# Patient Record
Sex: Female | Born: 1985 | Race: White | Hispanic: No | Marital: Married | Smoking: Never smoker
Health system: Southern US, Community
[De-identification: ages and names within clinical notes are randomized; demographics above are authoritative.]

## PROBLEM LIST (undated history)

## (undated) DIAGNOSIS — Z789 Other specified health status: Secondary | ICD-10-CM

## (undated) HISTORY — PX: FINGER SURGERY: SHX640

## (undated) HISTORY — DX: Other specified health status: Z78.9

---

## 2021-08-13 ENCOUNTER — Emergency Department (HOSPITAL_BASED_OUTPATIENT_CLINIC_OR_DEPARTMENT_OTHER): Payer: No Typology Code available for payment source

## 2021-08-13 ENCOUNTER — Other Ambulatory Visit: Payer: Self-pay

## 2021-08-13 ENCOUNTER — Encounter (HOSPITAL_BASED_OUTPATIENT_CLINIC_OR_DEPARTMENT_OTHER): Payer: Self-pay | Admitting: *Deleted

## 2021-08-13 ENCOUNTER — Emergency Department (HOSPITAL_BASED_OUTPATIENT_CLINIC_OR_DEPARTMENT_OTHER)
Admission: EM | Admit: 2021-08-13 | Discharge: 2021-08-13 | Disposition: A | Discharge status: Home / Self Care | Payer: No Typology Code available for payment source | Attending: Emergency Medicine | Admitting: Emergency Medicine

## 2021-08-13 DIAGNOSIS — R1110 Vomiting, unspecified: Secondary | ICD-10-CM | POA: Insufficient documentation

## 2021-08-13 DIAGNOSIS — R509 Fever, unspecified: Secondary | ICD-10-CM | POA: Insufficient documentation

## 2021-08-13 DIAGNOSIS — R1031 Right lower quadrant pain: Secondary | ICD-10-CM | POA: Insufficient documentation

## 2021-08-13 LAB — CBC WITH DIFFERENTIAL/PLATELET
Abs Immature Granulocytes: 0.03 10*3/uL (ref 0.00–0.07)
Basophils Absolute: 0 10*3/uL (ref 0.0–0.1)
Basophils Relative: 1 %
Eosinophils Absolute: 0.4 10*3/uL (ref 0.0–0.5)
Eosinophils Relative: 7 %
HCT: 40.2 % (ref 36.0–46.0)
Hemoglobin: 12.9 g/dL (ref 12.0–15.0)
Immature Granulocytes: 1 %
Lymphocytes Relative: 26 %
Lymphs Abs: 1.5 10*3/uL (ref 0.7–4.0)
MCH: 28 pg (ref 26.0–34.0)
MCHC: 32.1 g/dL (ref 30.0–36.0)
MCV: 87.2 fL (ref 80.0–100.0)
Monocytes Absolute: 0.6 10*3/uL (ref 0.1–1.0)
Monocytes Relative: 10 %
Neutro Abs: 3.3 10*3/uL (ref 1.7–7.7)
Neutrophils Relative %: 55 %
Platelets: 244 10*3/uL (ref 150–400)
RBC: 4.61 MIL/uL (ref 3.87–5.11)
RDW: 13.5 % (ref 11.5–15.5)
WBC: 5.8 10*3/uL (ref 4.0–10.5)
nRBC: 0 % (ref 0.0–0.2)

## 2021-08-13 LAB — PREGNANCY, URINE: Preg Test, Ur: NEGATIVE

## 2021-08-13 LAB — URINALYSIS, ROUTINE W REFLEX MICROSCOPIC
Glucose, UA: NEGATIVE mg/dL
Hgb urine dipstick: NEGATIVE
Ketones, ur: NEGATIVE mg/dL
Leukocytes,Ua: NEGATIVE
Nitrite: NEGATIVE
Protein, ur: 30 mg/dL — AB
Specific Gravity, Urine: >=1.03 (ref 1.005–1.030)
pH: 5.5 (ref 5.0–8.0)

## 2021-08-13 LAB — COMPREHENSIVE METABOLIC PANEL
ALT: 42 U/L (ref 0–44)
AST: 29 U/L (ref 15–41)
Albumin: 3.9 g/dL (ref 3.5–5.0)
Alkaline Phosphatase: 81 U/L (ref 38–126)
Anion gap: 9 (ref 5–15)
BUN: 13 mg/dL (ref 6–20)
CO2: 26 mmol/L (ref 22–32)
Calcium: 8.8 mg/dL — ABNORMAL LOW (ref 8.9–10.3)
Chloride: 101 mmol/L (ref 98–111)
Creatinine, Ser: 0.7 mg/dL (ref 0.44–1.00)
GFR, Estimated: >60 mL/min (ref >60)
Glucose, Bld: 100 mg/dL — ABNORMAL HIGH (ref 70–99)
Potassium: 3.6 mmol/L (ref 3.5–5.1)
Sodium: 136 mmol/L (ref 135–145)
Total Bilirubin: 0.4 mg/dL (ref 0.3–1.2)
Total Protein: 7.3 g/dL (ref 6.5–8.1)

## 2021-08-13 LAB — URINALYSIS, MICROSCOPIC (REFLEX)
RBC / HPF: NONE SEEN RBC/hpf (ref 0–5)
WBC, UA: NONE SEEN WBC/hpf (ref 0–5)

## 2021-08-13 LAB — LIPASE, BLOOD: Lipase: 30 U/L (ref 11–51)

## 2021-08-13 MED ORDER — IOHEXOL 300 MG/ML  SOLN
100.0000 mL | Freq: Once | INTRAMUSCULAR | Status: AC | PRN
  Administered 2021-08-13: 100 mL via INTRAVENOUS

## 2021-08-13 MED ORDER — ONDANSETRON HCL 4 MG/2ML IJ SOLN
4.0000 mg | Freq: Once | INTRAMUSCULAR | Status: AC
  Administered 2021-08-13: 4 mg via INTRAVENOUS
  Filled 2021-08-13: qty 2

## 2021-08-13 MED ORDER — SODIUM CHLORIDE 0.9 % IV BOLUS
1000.0000 mL | Freq: Once | INTRAVENOUS | Status: AC
  Administered 2021-08-13: 1000 mL via INTRAVENOUS

## 2021-08-13 MED ORDER — KETOROLAC TROMETHAMINE 30 MG/ML IJ SOLN
30.0000 mg | Freq: Once | INTRAMUSCULAR | Status: AC
  Administered 2021-08-13: 30 mg via INTRAVENOUS
  Filled 2021-08-13: qty 1

## 2021-08-13 NOTE — Discharge Instructions (Signed)
You have been seen and discharged from the emergency department.  Your CAT scan showed no surgical finding.  Take over-the-counter medications as needed for symptom control, stay well-hydrated.  Follow-up with your primary provider for further evaluation and further care. Take home medications as prescribed. If you have any worsening symptoms or further concerns for your health please return to an emergency department for further evaluation. ?

## 2021-08-13 NOTE — ED Triage Notes (Signed)
Back pain since yesterday. Fever 102 with vomiting. ?

## 2021-08-13 NOTE — ED Provider Notes (Signed)
?[facility] ?Provider Note ? ? ?CSN: [id number] ?Arrival date & time: 08/13/21  1550 ? ?  ? ?History ? ?Chief Complaint  ?Patient presents with  ? Back Pain  ? ? ?[name] is a 36 y.o. female. ? ?HPI ? ?36 year old female with no admitted past medical history presents emergency department complaining of right lower quadrant abdominal pain flank pain.  Patient states this started yesterday.  Initially the pain was right lower quadrant now it is radiating around to bilateral flanks.  She denies any history of kidney stones, no genitourinary symptoms.  She has diarrhea at baseline but her last bowel movement was a couple days ago which is unusual for her.  She had 1 episode of emesis yesterday, nonbloody.  No history of any surgeries in the abdomen.  She had a fever yesterday, Tmax 102. ? ?Home Medications ?Prior to Admission medications   ?Not on File  ?   ? ?Allergies    ?Patient has no known allergies.   ? ?Review of Systems   ?Review of Systems  ?Constitutional:  Positive for appetite change and fever.  ?Respiratory:  Negative for shortness of breath.   ?Cardiovascular:  Negative for chest pain.  ?Gastrointestinal:  Positive for abdominal pain, diarrhea, nausea and vomiting.  ?Genitourinary:  Positive for flank pain. Negative for dysuria, vaginal bleeding, vaginal discharge and vaginal pain.  ?Skin:  Negative for rash.  ?Neurological:  Negative for headaches.  ? ?Physical Exam ?Updated Vital Signs ?BP 114/79   Pulse 80   Temp 98.6 ?F (37 ?C) (Oral)   Resp 18   Ht 5\' 4"  (1.626 m)   Wt 81.6 kg   LMP 08/06/2021   SpO2 95%   BMI 30.90 kg/m?  ?Physical Exam ?Vitals and nursing note reviewed.  ?Constitutional:   ?   General: She is not in acute distress. ?   Appearance: Normal appearance. She is not diaphoretic.  ?HENT:  ?   Head: Normocephalic.  ?   Mouth/Throat:  ?   Mouth: Mucous membranes are moist.  ?Cardiovascular:  ?   Rate and Rhythm: Normal rate.  ?Pulmonary:  ?   Effort:  Pulmonary effort is normal. No respiratory distress.  ?Abdominal:  ?   Palpations: Abdomen is soft.  ?   Tenderness: There is abdominal tenderness. There is no guarding or rebound.  ?Skin: ?   General: Skin is warm.  ?Neurological:  ?   Mental Status: She is alert and oriented to person, place, and time. Mental status is at baseline.  ?Psychiatric:     ?   Mood and Affect: Mood normal.  ? ? ?ED Results / Procedures / Treatments   ?Labs ?(all labs ordered are listed, but only abnormal results are displayed) ?Labs Reviewed  ?URINALYSIS, ROUTINE W REFLEX MICROSCOPIC - Abnormal; Notable for the following components:  ?    Result Value  ? Color, Urine ORANGE (*)   ? Bilirubin Urine SMALL (*)   ? Protein, ur 30 (*)   ? All other components within normal limits  ?URINALYSIS, MICROSCOPIC (REFLEX) - Abnormal; Notable for the following components:  ? Bacteria, UA RARE (*)   ? All other components within normal limits  ?PREGNANCY, URINE  ?CBC WITH DIFFERENTIAL/PLATELET  ?COMPREHENSIVE METABOLIC PANEL  ?LIPASE, BLOOD  ? ? ?EKG ?None ? ?Radiology ?No results found. ? ?Procedures ?Procedures  ? ? ?Medications Ordered in ED ?Medications  ?ketorolac (TORADOL) 30 MG/ML injection 30 mg (30 mg Intravenous Given 08/13/21 1932)  ?  ondansetron [facility]) injection 4 mg (4 mg Intravenous Given 08/13/21 1929)  ?sodium chloride 0.9 % bolus 1,000 mL (1,000 mLs Intravenous New Bag/Given 08/13/21 1925)  ? ? ?ED Course/ Medical Decision Making/ A&P ?  ?                        ?Medical Decision Making ?Amount and/or Complexity of Data Reviewed ?Labs: ordered. ?Radiology: ordered. ? ?Risk ?Prescription drug management. ? ? ?36 year old female presents emergency department concern for right lower quadrant and bilateral flank pain.  Denies any genitourinary symptoms.  Had a fever yesterday with vomiting.  Vitals are normal and stable on arrival.  Abdomen is benign.  Blood work is reassuring. ? ?CT the abdomen pelvis identifies no acute pathology.  After  IV medication she feels much improved, was able to p.o.  Patient at this time appears safe and stable for discharge and close outpatient follow up. Discharge plan and strict return to ED precautions discussed, patient verbalizes understanding and agreement. ? ? ? ? ? ? ? ?Final Clinical Impression(s) / ED Diagnoses ?Final diagnoses:  ?None  ? ? ?Rx / DC Orders ?ED Discharge Orders   ? ? None  ? ?  ? ? ?  ?Lorelle Gibbs, DO ?08/13/21 2326 ? ?

## 2022-07-17 IMAGING — CT CT ABD-PELV W/ CM
2 of 4 series · 16 of 46 positions shown, 18 images · IV contrast (Omnipaque)
Comparison: None.

CLINICAL DATA: Abdominal pain.

EXAM:
CT ABDOMEN AND PELVIS WITH CONTRAST
TECHNIQUE: Multidetector CT imaging of the abdomen and pelvis was performed
using the standard protocol following bolus administration of
intravenous contrast.

[Series 2: axial st · axial · 0.98mm/px · z∈[+704,+1139]mm · 13 of 95 slices shown, 15 images]
[im 4/95  soft-tissue]
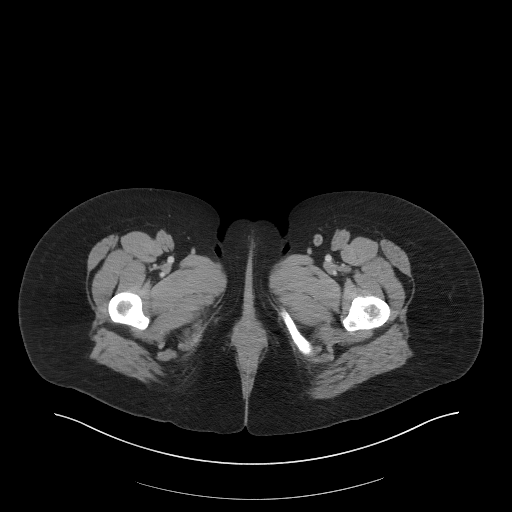
[im 4/95  bone]
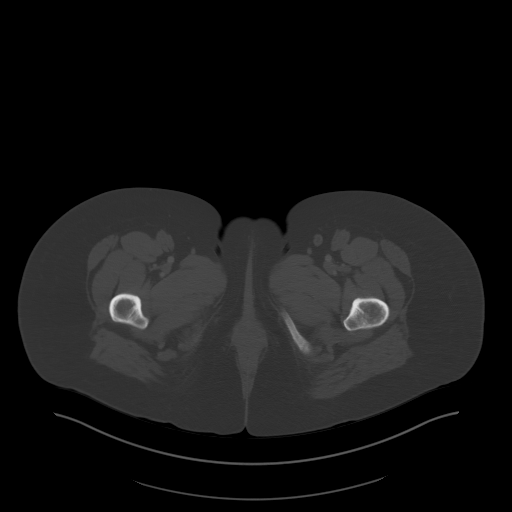
[im 12/95  soft-tissue]
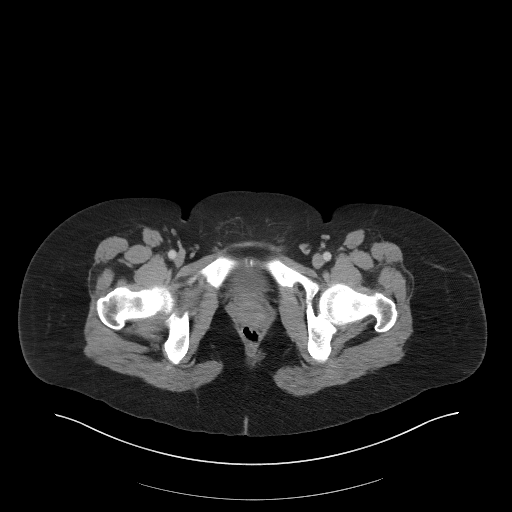
[im 20/95  soft-tissue]
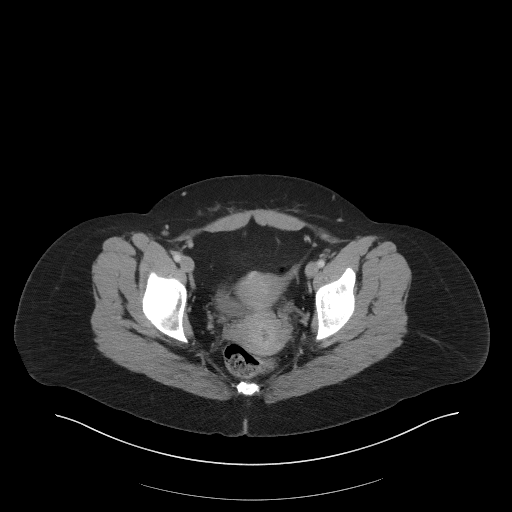
[im 28/95  soft-tissue]
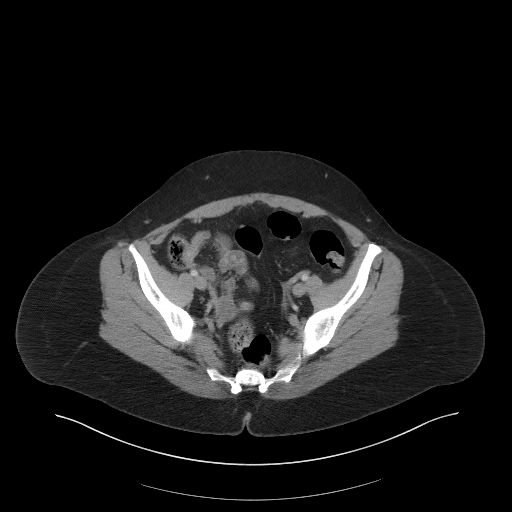
[im 32/95  soft-tissue]
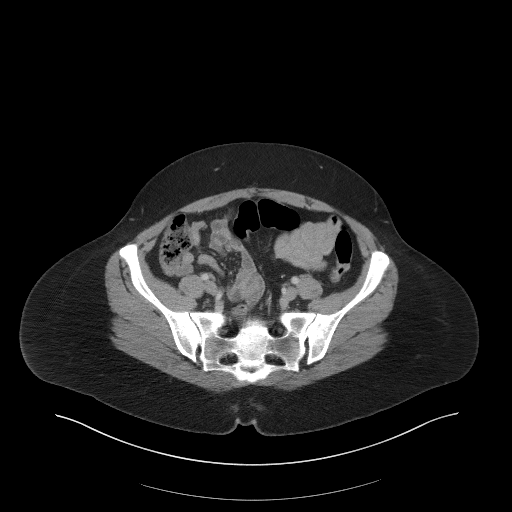
[im 40/95  soft-tissue]
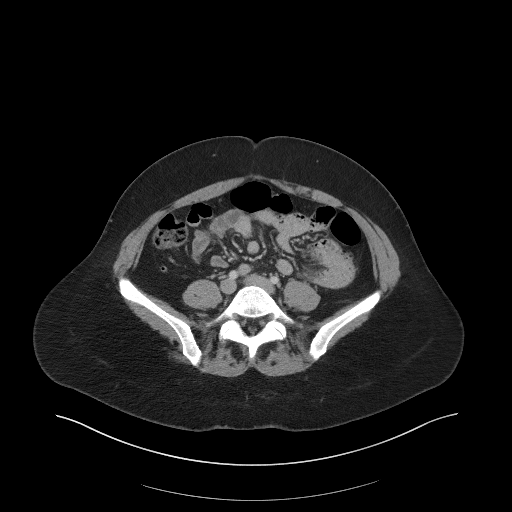
[im 48/95  soft-tissue]
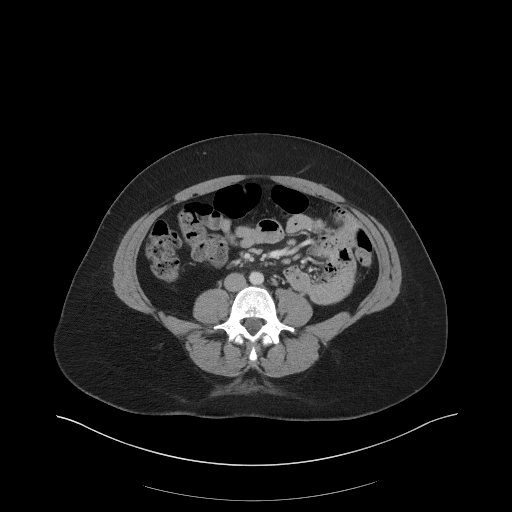
[im 55/95  soft-tissue]
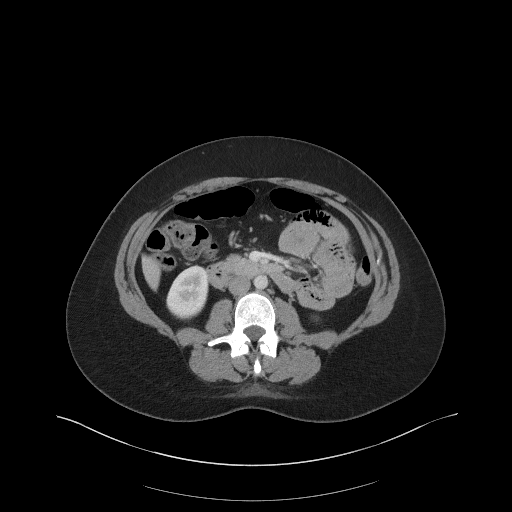
[im 63/95  soft-tissue]
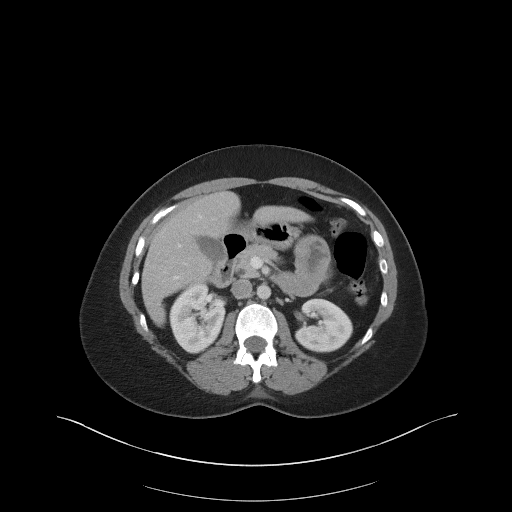
[im 63/95  bone]
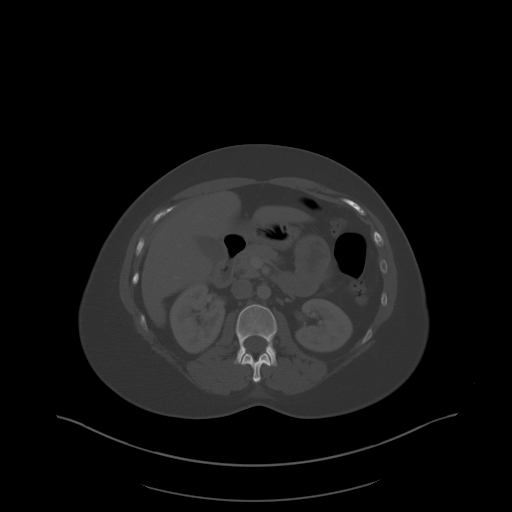
[im 67/95  soft-tissue]
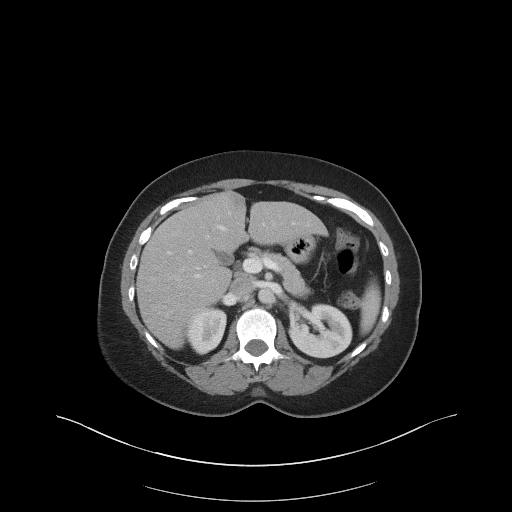
[im 75/95  soft-tissue]
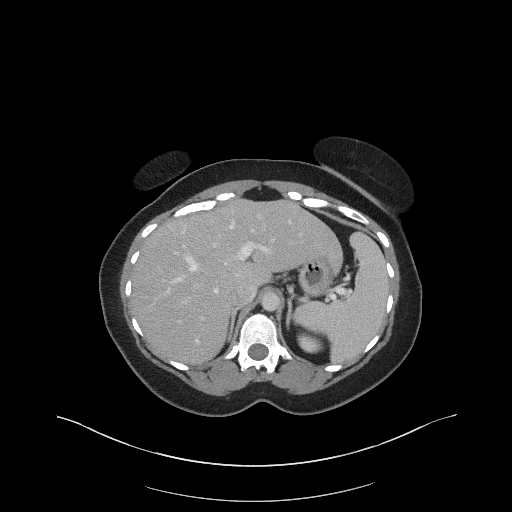
[im 83/95  soft-tissue]
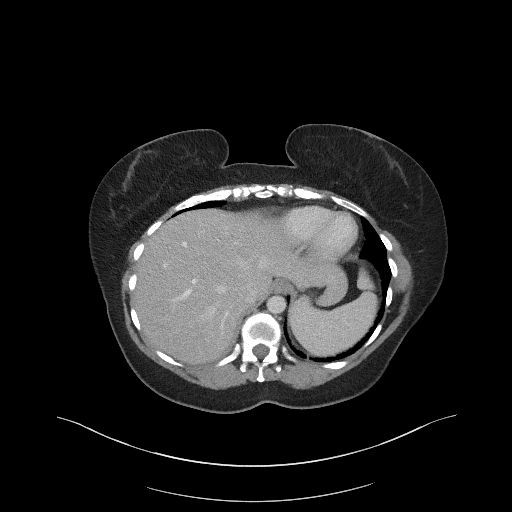
[im 91/95  soft-tissue]
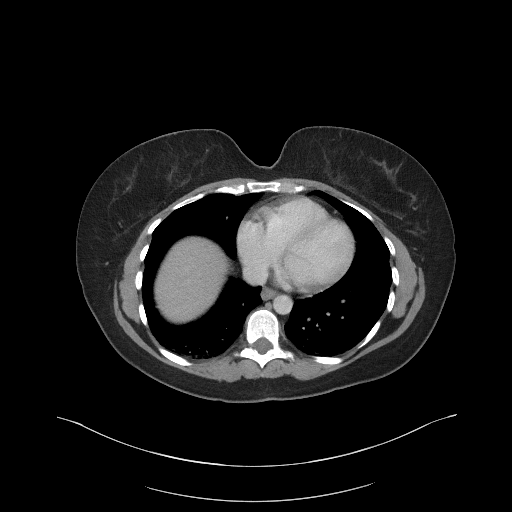

[Series 5: coronal st · coronal · 0.81mm/px · 3 of 110 slices shown]
[im 37/110  soft-tissue]
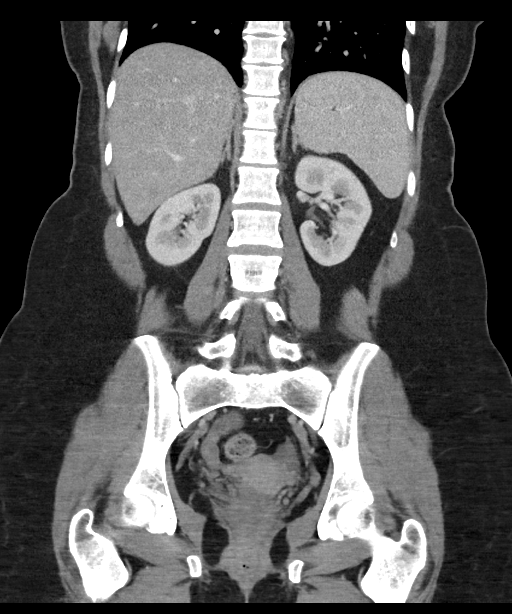
[im 49/110  soft-tissue]
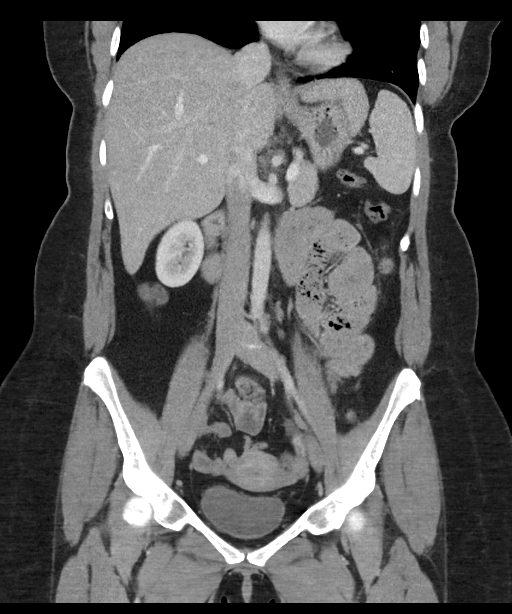
[im 61/110  soft-tissue]
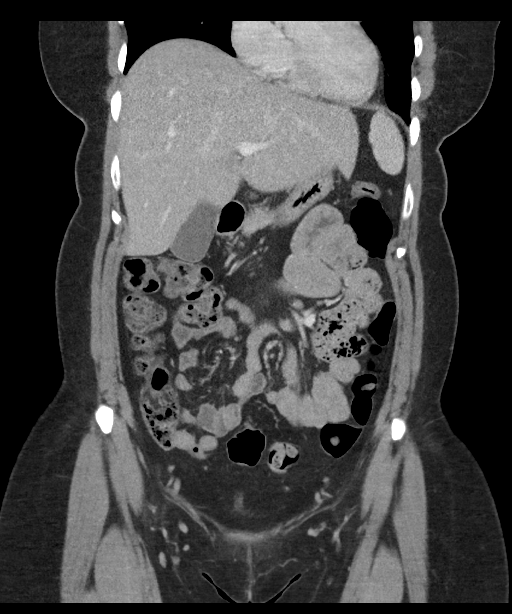

[16 of 46 positions shown; findings below may reference images not displayed]

RADIATION DOSE REDUCTION: This exam was performed according to the
departmental dose-optimization program which includes automated
exposure control, adjustment of the mA and/or kV according to
patient size and/or use of iterative reconstruction technique.

CONTRAST:  100mL OMNIPAQUE IOHEXOL 300 MG/ML  SOLN
FINDINGS: Lower chest: Minimal bibasilar subpleural atelectasis. The
visualized lung bases are otherwise clear.

No intra-abdominal free air.  Trace free fluid in the pelvis.

Hepatobiliary: Apparent mild fatty liver. No intrahepatic biliary
duct dilatation. The gallbladder is unremarkable.

Pancreas: Unremarkable. No pancreatic ductal dilatation or
surrounding inflammatory changes.

Spleen: Normal in size without focal abnormality.

Adrenals/Urinary Tract: Adrenal glands are unremarkable. Kidneys are
normal, without renal calculi, focal lesion, or hydronephrosis.
Bladder is unremarkable.

Stomach/Bowel: No bowel obstruction or active inflammation. The
appendix is normal.

Vascular/Lymphatic: The abdominal aorta and IVC unremarkable. No
portal venous gas. There is no adenopathy.

Reproductive: The uterus is anteverted and grossly unremarkable. No
adnexal masses.

Other: None

Musculoskeletal: No acute or significant osseous findings.
IMPRESSION: No acute intra-abdominal or pelvic pathology.

## 2022-10-29 ENCOUNTER — Ambulatory Visit: Payer: BC Managed Care – PPO | Attending: Cardiology

## 2022-10-29 ENCOUNTER — Ambulatory Visit: Payer: 59 | Attending: Cardiology | Admitting: Cardiology

## 2022-10-29 ENCOUNTER — Encounter: Payer: Self-pay | Admitting: Cardiology

## 2022-10-29 VITALS — BP 118/72 | HR 81 | Ht 64.0 in | Wt 194.6 lb

## 2022-10-29 DIAGNOSIS — R42 Dizziness and giddiness: Secondary | ICD-10-CM

## 2022-10-29 DIAGNOSIS — R0789 Other chest pain: Secondary | ICD-10-CM | POA: Diagnosis not present

## 2022-10-29 NOTE — Patient Instructions (Addendum)
Medication Instructions:  Your physician recommends that you continue on your current medications as directed. Please refer to the Current Medication list given to you today.  *If you need a refill on your cardiac medications before your next appointment, please call your pharmacy*   Lab Work: None   Testing/Procedures: Your physician has requested that you have an echocardiogram. Echocardiography is a painless test that uses sound waves to create images of your heart. It provides your doctor with information about the size and shape of your heart and how well your heart's chambers and valves are working. This procedure takes approximately one hour. There are no restrictions for this procedure. Please do NOT wear cologne, perfume, aftershave, or lotions (deodorant is allowed). Please arrive 15 minutes prior to your appointment time.  ZIO AT Long term monitor-Live Telemetry  Your physician has requested you wear a ZIO patch monitor for 14 days.  This is a single patch monitor. [employer] supplies one patch monitor per enrollment. Additional  stickers are not available.  Please do not apply patch if you will be having a Nuclear Stress Test, Echocardiogram, Cardiac CT, MRI,  or Chest Xray during the period you would be wearing the monitor. The patch cannot be worn during  these tests. You cannot remove and re-apply the ZIO AT patch monitor.  Your ZIO patch monitor will be mailed 3 day USPS to your address on file. It may take 3-5 days to  receive your monitor after you have been enrolled.  Once you have received your monitor, please review the enclosed instructions. Your monitor has  already been registered assigning a specific monitor serial # to you.   Billing and Patient Assistance Program information  Meredeth Ide has been supplied with any insurance information on record for billing. [employer] offers a sliding scale Patient Assistance Program for patients without insurance, or whose  insurance  does not completely cover the cost of the ZIO patch monitor. You must apply for the  Patient Assistance Program to qualify for the discounted rate. To apply, call [employer] at [phone],  select option 4, select option 2 , ask to apply for the Patient Assistance Program, (you can request an  interpreter if needed). [employer] will ask your household income and how many people are in your  household. [employer] will quote your out-of-pocket cost based on this information. They will also be able  to set up a 12 month interest free payment plan if needed.  Applying the monitor   Shave hair from upper left chest.  Hold the abrader disc by orange tab. Rub the abrader in 40 strokes over left upper chest as indicated in  your monitor instructions.  Clean area with 4 enclosed alcohol pads. Use all pads to ensure the area is cleaned thoroughly. Let  dry.  Apply patch as indicated in monitor instructions. Patch will be placed under collarbone on left side of  chest with arrow pointing upward.  Rub patch adhesive wings for 2 minutes. Remove the white label marked "1". Remove the white label  marked "2". Rub patch adhesive wings for 2 additional minutes.  While looking in a mirror, press and release button in center of patch. A small green light will flash 3-4  times. This will be your only indicator that the monitor has been turned on.  Do not shower for the first 24 hours. You may shower after the first 24 hours.  Press the button if you feel a symptom. You will hear a small click.  Record Date, Time and Symptom in  the Patient Log.   Starting the Gateway  In your kit there is a Audiological scientist box the size of a cellphone. This is Buyer, retail. It transmits all your  recorded data to [facility]. This box must always stay within 10 feet of you. Open the box and push the *  button. There will be a light that blinks orange and then green a few times. When the light stops  blinking, the Gateway is connected to  the ZIO patch. Call [employer] at [phone] to confirm your monitor is transmitting.  Returning your monitor  Remove your patch and place it inside the Gateway. In the lower half of the Gateway there is a white  bag with prepaid postage on it. Place Gateway in bag and seal. Mail package back to [place] as soon as  possible. Your physician should have your final report approximately 7 days after you have mailed back  your monitor. Call [facility] Customer Care at [phone] if you have questions regarding your ZIO AT  patch monitor. Call them immediately if you see an orange light blinking on your monitor.  If your monitor falls off in less than 4 days, contact our Monitor department at [phone]. If your  monitor becomes loose or falls off after 4 days call [employer] at [phone] for suggestions on  securing your monitor   Follow-Up: At [facility], you and your health needs are our priority.  As part of our continuing mission to provide you with exceptional heart care, we have created designated Provider Care Teams.  These Care Teams include your primary Cardiologist (physician) and Advanced Practice Providers (APPs -  Physician Assistants and Nurse Practitioners) who all work together to provide you with the care you need, when you need it.  We recommend signing up for the patient portal called "MyChart".  Sign up information is provided on this After Visit Summary.  MyChart is used to connect with patients for Virtual Visits (Telemedicine).  Patients are able to view lab/test results, encounter notes, upcoming appointments, etc.  Non-urgent messages can be sent to your provider as well.   To learn more about what you can do with MyChart, go to [email or web address].    Your next appointment:   12 week(s)  Provider:   Thomasene Ripple, DO

## 2022-10-29 NOTE — Progress Notes (Unsigned)
Enrolled for [employer] to mail a ZIO AT Live Telemetry monitor to patients address on file.  

## 2022-10-29 NOTE — Progress Notes (Unsigned)
[facility]  {Choose New Eval or Follow Up [phone]}   Prior CV Studies Reviewed: The following studies were reviewed today: ***  No past medical history on file.  No past surgical history on file. { Click here to update PMH, PSH, OB Hx then refresh note  :1}   OB History   No obstetric history on file.     { Click here to update OB Charting then refresh note  :1}    Current Medications: Current Meds  Medication Sig   Prenatal Multivit-Min-Fe-FA (PRE-NATAL FORMULA PO) Take by mouth.     Allergies:   Patient has no known allergies.   Social History   Socioeconomic History   Marital status: Married    Spouse name: Not on file   Number of children: Not on file   Years of education: Not on file   Highest education level: Not on file  Occupational History   Not on file  Tobacco Use   Smoking status: Never   Smokeless tobacco: Never  Vaping Use   Vaping Use: Never used  Substance and Sexual Activity   Alcohol use: Not Currently   Drug use: Never   Sexual activity: Not on file  Other Topics Concern   Not on file  Social History Narrative   Not on file   Social Determinants of Health   Financial Resource Strain: Not on file  Food Insecurity: Not on file  Transportation Needs: Not on file  Physical Activity: Not on file  Stress: Not on file  Social Connections: Not on file  { Click here to update SDOH then refresh :1}    No family history on file. { Click here to update FH then refresh note    :1}   ROS:   Please see the history of present illness.    *** All other systems reviewed and are negative.   Labs/EKG Reviewed:    EKG:   EKG is *** ordered today.  The ekg ordered today demonstrates ***  Recent Labs: No results found for requested labs within last 365 days.   Recent Lipid Panel No results found for: "CHOL", "TRIG", "HDL", "CHOLHDL", "LDLCALC", "LDLDIRECT"  Physical Exam:    VS:  BP 118/72   Pulse 81   Ht 5'  4" (1.626 m)   Wt 194 lb 9.6 oz (88.3 kg)   SpO2 97%   BMI 33.40 kg/m     Wt Readings from Last 3 Encounters:  10/29/22 194 lb 9.6 oz (88.3 kg)  08/13/21 180 lb (81.6 kg)     GEN: *** Well nourished, well developed in no acute distress HEENT: Normal NECK: No JVD; No carotid bruits LYMPHATICS: No lymphadenopathy CARDIAC: ***RRR, no murmurs, rubs, gallops RESPIRATORY:  Clear to auscultation without rales, wheezing or rhonchi  ABDOMEN: Soft, non-tender, non-distended MUSCULOSKELETAL:  No edema; No deformity  SKIN: Warm and dry NEUROLOGIC:  Alert and oriented x 3 PSYCHIATRIC:  Normal affect    Risk Assessment/Risk Calculators:   { Click to calculate CARPREG II - THEN refresh note :1}    { Click to caclulate Mod WHO Class of CV Risk - THEN refresh note :1}     { Click for CHADS2VASc Score - THEN Refresh Note    :[id number]}      ASSESSMENT & PLAN:    *** There are no Patient Instructions on file for this visit.   Dispo:  No follow-ups on file.   Medication Adjustments/Labs and Tests Ordered: Current medicines are reviewed  at length with the patient today.  Concerns regarding medicines are outlined above.  Tests Ordered: No orders of the defined types were placed in this encounter.  Medication Changes: No orders of the defined types were placed in this encounter.

## 2022-11-02 DIAGNOSIS — R42 Dizziness and giddiness: Secondary | ICD-10-CM

## 2022-11-05 ENCOUNTER — Encounter (HOSPITAL_BASED_OUTPATIENT_CLINIC_OR_DEPARTMENT_OTHER): Payer: Self-pay

## 2022-11-05 ENCOUNTER — Emergency Department (HOSPITAL_BASED_OUTPATIENT_CLINIC_OR_DEPARTMENT_OTHER)
Admission: EM | Admit: 2022-11-05 | Discharge: 2022-11-05 | Disposition: A | Discharge status: Home / Self Care | Payer: BC Managed Care – PPO | Attending: Emergency Medicine | Admitting: Emergency Medicine

## 2022-11-05 ENCOUNTER — Emergency Department (HOSPITAL_BASED_OUTPATIENT_CLINIC_OR_DEPARTMENT_OTHER): Payer: BC Managed Care – PPO

## 2022-11-05 ENCOUNTER — Other Ambulatory Visit: Payer: Self-pay

## 2022-11-05 DIAGNOSIS — R079 Chest pain, unspecified: Secondary | ICD-10-CM

## 2022-11-05 DIAGNOSIS — R0789 Other chest pain: Secondary | ICD-10-CM | POA: Diagnosis not present

## 2022-11-05 LAB — BASIC METABOLIC PANEL
Anion gap: 7 (ref 5–15)
BUN: 9 mg/dL (ref 6–20)
CO2: 22 mmol/L (ref 22–32)
Calcium: 8.7 mg/dL — ABNORMAL LOW (ref 8.9–10.3)
Chloride: 104 mmol/L (ref 98–111)
Creatinine, Ser: 0.56 mg/dL (ref 0.44–1.00)
GFR, Estimated: >60 mL/min (ref >60)
Glucose, Bld: 90 mg/dL (ref 70–99)
Potassium: 3.7 mmol/L (ref 3.5–5.1)
Sodium: 133 mmol/L — ABNORMAL LOW (ref 135–145)

## 2022-11-05 LAB — CBC
HCT: 32.5 % — ABNORMAL LOW (ref 36.0–46.0)
Hemoglobin: 10.3 g/dL — ABNORMAL LOW (ref 12.0–15.0)
MCH: 27.2 pg (ref 26.0–34.0)
MCHC: 31.7 g/dL (ref 30.0–36.0)
MCV: 85.8 fL (ref 80.0–100.0)
Platelets: 301 10*3/uL (ref 150–400)
RBC: 3.79 MIL/uL — ABNORMAL LOW (ref 3.87–5.11)
RDW: 13.8 % (ref 11.5–15.5)
WBC: 9.7 10*3/uL (ref 4.0–10.5)
nRBC: 0 % (ref 0.0–0.2)

## 2022-11-05 LAB — TROPONIN I (HIGH SENSITIVITY)
Troponin I (High Sensitivity): <2 ng/L (ref <18)
Troponin I (High Sensitivity): 4 ng/L (ref <18)

## 2022-11-05 NOTE — ED Triage Notes (Signed)
Patient having chest pain for three weeks. She is wearing a heart monitor. She has a cough.

## 2022-11-05 NOTE — Discharge Instructions (Signed)
You were seen in the emergency department for your chest pain.  Your workup showed no signs of stress on your heart or abnormalities within your lungs.  It is unclear what is causing your symptoms at this time but you should follow-up with cardiology next week as planned.  You should return to the emergency department if your chest pains getting significantly worse or does not go away, you start develop shortness of breath, you have swelling in 1 leg more compared to the other or if you have any other new or concerning symptoms.

## 2022-11-05 NOTE — ED Provider Notes (Signed)
[place] [facility] Provider Note   CSN: [id number] Arrival date & time: 11/05/22  0827     History  Chief Complaint  Patient presents with   Chest Pain    [name] is a 37 y.o. female.  Patient is a 37 year old female at [redacted] weeks gestation presenting to the emergency department with chest pain.  Patient states about 3 weeks ago she started to develop some sharp intermittent chest pains.  She states that she was seen by cardiology and currently has a Zio patch on and is scheduled for an outpatient echo.  She states that she had been chest pain-free until this morning while at work.  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  She states that it feels like a sharp stabbing pain on the left side of her chest radiating down her left arm.  She states that it has been coming and going throughout the morning and last for just a second at a time.  She denies any associated shortness of breath.  She states that she did feel nauseous and lightheaded.  She denies any vomiting or diaphoresis.  She denies any lower extremity swelling.  She denies any prior complications with this pregnancy.  The history is provided by the patient.  Chest Pain      Home Medications Prior to Admission medications   Medication Sig Start Date End Date Taking? Authorizing Provider  Prenatal Multivit-Min-Fe-FA (PRE-NATAL FORMULA PO) Take 1 tablet by mouth daily.   Yes [provider]      Allergies    Patient has no known allergies.    Review of Systems   Review of Systems  Cardiovascular:  Positive for chest pain.    Physical Exam Updated Vital Signs BP 106/65   Pulse 79   Temp 98 F (36.7 C) (Oral)   Resp 16   Ht 5\' 4"  (1.626 m)   Wt 88.5 kg   SpO2 96%   BMI 33.47 kg/m  Physical Exam Vitals and nursing note reviewed.  Constitutional:      General: She is not in acute distress.     Appearance: She is well-developed.  HENT:     Head: Normocephalic and atraumatic.  Eyes:     Extraocular Movements: Extraocular movements intact.  Cardiovascular:     Rate and Rhythm: Normal rate and regular rhythm.     Pulses:          Radial pulses are 2+ on the right side and 2+ on the left side.       Dorsalis pedis pulses are 2+ on the right side and 2+ on the left side.     Heart sounds: Normal heart sounds.  Pulmonary:     Effort: Pulmonary effort is normal.     Breath sounds: Normal breath sounds.  Chest:     Chest wall: No tenderness.  Abdominal:     Palpations: Abdomen is soft.     Tenderness: There is no abdominal tenderness.     Comments: Gravid abdomen  Musculoskeletal:        General: Normal range of motion.     Cervical back: Normal range of motion and neck supple.     Right lower leg: No edema.     Left lower leg: No edema.  Skin:    General: Skin is warm and dry.  Neurological:     General:  No focal deficit present.     Mental Status: She is alert and oriented to person, place, and time.  Psychiatric:        Mood and Affect: Mood normal.        Behavior: Behavior normal.     ED Results / Procedures / Treatments   Labs (all labs ordered are listed, but only abnormal results are displayed) Labs Reviewed  BASIC METABOLIC PANEL - Abnormal; Notable for the following components:      Result Value   Sodium 133 (*)    Calcium 8.7 (*)    All other components within normal limits  CBC - Abnormal; Notable for the following components:   RBC 3.79 (*)    Hemoglobin 10.3 (*)    HCT 32.5 (*)    All other components within normal limits  TROPONIN I (HIGH SENSITIVITY)  TROPONIN I (HIGH SENSITIVITY)    EKG EKG Interpretation  Date/Time:  Friday November 05 2022 08:35:35 EDT Ventricular Rate:  88 PR Interval:  106 QRS Duration: 85 QT Interval:  355 QTC Calculation: 430 R Axis:   76 Text Interpretation: Sinus rhythm Short PR interval No previous ECGs  available Confirmed by Elayne Snare ([id number]) on 11/05/2022 8:42:12 AM  Radiology DG Chest 2 View  Result Date: 11/05/2022 CLINICAL DATA:  Cough. EXAM: CHEST - 2 VIEW COMPARISON:  None Available. FINDINGS: Cardiac monitor projects over the left anterior chest wall. Low lung volumes accentuate the pulmonary vasculature and cardiomediastinal silhouette. No consolidation or pulmonary edema. No pleural effusion or pneumothorax. Visualized bones and upper abdomen are unremarkable. IMPRESSION: No evidence of acute cardiopulmonary disease. Electronically Signed   By: Orvan Falconer M.D.   On: 11/05/2022 09:22    Procedures Procedures    Medications Ordered in ED Medications - No data to display  ED Course/ Medical Decision Making/ A&P Clinical Course as of 11/05/22 1208  Fri Nov 05, 2022  0940 Initian troponin negative. Will need repeat at two hours as symptoms started just prior to arrival. Mild anemia, expected in pregnancy. No acute abnormalities on CXR. [VK]  1149 Rpt trop negative. Work up overall reassuring. She is stable for discharge home with continued cardiology/OB work up. [VK]    Clinical Course User Index [VK] Rexford Maus, DO                             Medical Decision Making This patient presents to the ED with chief complaint(s) of chest pain with pertinent past medical history of [redacted] weeks pregnant which further complicates the presenting complaint. The complaint involves an extensive differential diagnosis and also carries with it a high risk of complications and morbidity.    The differential diagnosis includes considering ACS, arrhythmia, pericarditis, myocarditis, anemia, electrolyte abnormality, pneumonia, pneumothorax, pulmonary edema, pleural effusion, PE unlikely as patient has no associated shortness of breath no evidence of PE on exam, is hemodynamically stable and has had on and off symptoms for several weeks  Additional history obtained: Additional history  obtained from spouse Records reviewed outpatient cardiology records  ED Course and Reassessment: On patient's arrival to the emergency department she is hemodynamically stable in no acute distress.  The patient's EKG on arrival showed normal sinus rhythm without acute ischemic changes.  Patient will have labs including troponin and will need 2 sets of troponin if initial is normal due to recent onset of pain.  She is agreeable to chest x-ray.  Patient  will be closely reassessed.  Independent labs interpretation:  The following labs were independently interpreted: mild anemia, otherwise within normal range  Independent visualization of imaging: - I independently visualized the following imaging with scope of interpretation limited to determining acute life threatening conditions related to emergency care: CXR, which revealed no acute disease  Consultation: - Consulted or discussed management/test interpretation w/ external professional: N/A  Consideration for admission or further workup: Patient has no emergent conditions requiring admission or further work-up at this time and is stable for discharge home with primary care and cardiology follow-up  Social Determinants of health: N/A    Amount and/or Complexity of Data Reviewed Labs: ordered. Radiology: ordered.          Final Clinical Impression(s) / ED Diagnoses Final diagnoses:  Nonspecific chest pain    Rx / DC Orders ED Discharge Orders     None         Rexford Maus, DO 11/05/22 1208

## 2022-11-11 ENCOUNTER — Ambulatory Visit: Payer: BC Managed Care – PPO

## 2022-11-11 ENCOUNTER — Other Ambulatory Visit: Payer: Self-pay | Admitting: Cardiology

## 2022-11-11 ENCOUNTER — Telehealth: Payer: Self-pay | Admitting: Cardiology

## 2022-11-11 ENCOUNTER — Ambulatory Visit (HOSPITAL_COMMUNITY): Payer: BC Managed Care – PPO | Attending: Cardiology

## 2022-11-11 DIAGNOSIS — R0789 Other chest pain: Secondary | ICD-10-CM | POA: Insufficient documentation

## 2022-11-11 DIAGNOSIS — Z79899 Other long term (current) drug therapy: Secondary | ICD-10-CM | POA: Diagnosis present

## 2022-11-11 LAB — ECHOCARDIOGRAM COMPLETE
Area-P 1/2: 4.24 cm2
S' Lateral: 2.8 cm

## 2022-11-11 NOTE — Telephone Encounter (Signed)
Patient seen in echo department as while receiving echo, she had an episode of NSVT. She is currently wearing a cardiac monitor. TTE reviewed and shows normal BiV function with no significant valve disease. She denies any other significant palpitations other than this episode and is currently being worked up for atypical chest burning. Will check BMET and Mg today, follow-up cardiac monitor and see in Cardio OB clinic tomorrow. If she has recurrent episodes, will plan for BB and possible ETT for further evaluation.  Laurance Flatten, MD

## 2022-11-11 NOTE — Telephone Encounter (Signed)
Called the pt and informed her that Dr. Shari Prows would like to see her at [facility] tomorrow, given Dr. Servando Salina is away from the office at this time.  Scheduled the pt as a double book to see Dr. Shari Prows at [facility] clinic for tomorrow 6/14 at 3:40 pm.  She is aware to arrive 15 mins prior to this appt.  Pt verbalized understanding and agrees with this plan.

## 2022-11-11 NOTE — Progress Notes (Signed)
Cardio-Obstetrics Clinic  Follow Up Note   Date:  11/12/2022   ID:  [name], DOB 1986-04-02, MRN [id number]  PCP:  Pcp, No   [facility] Providers Cardiologist:  None  Electrophysiologist:  None        Referring MD: No ref. provider found   Chief Complaint: chest pain  History of Present Illness:    [name] is a 37 y.o. female [G1P0] who returns for follow up of chest pain.  Patient initially seen by Dr. Servando Harrison on 09/2022 for episodes of intermittent chest pain that occurs at rest and with exertion. She was recommended for a cardiac monitor as well as an echo. During her visit for her echo on 11/11/22, she had an episode of NSVT during the study prompting return visit. Fortunately, TTE showed normal BiV function and no significant valve disease.  Today, the patient is currently [redacted]w[redacted]d pregnant. States that she has been having episodes of chest heaviness and lightheadedness especially when walking the stairs. 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 Has associated SOB at that time. Symptoms have increased in frequency and duration with her pregnancy and can last several hours at a time. No episodes of syncope. No LE edema, orthopnea, or PND. Had that one episode of palpitations during the echo but no other episodes of palpitations. All symptoms began in pregnancy.   No known family history of CAD, HF. No personal history of CAD or HF. No complications in prior pregnancy.   Prior CV Studies Reviewed: The following studies were reviewed today: Cardiac Studies & Procedures       ECHOCARDIOGRAM  ECHOCARDIOGRAM COMPLETE 11/11/2022  Narrative ECHOCARDIOGRAM REPORT    Patient Name:   [name]   Date of Exam: 11/11/2022 Medical Rec #:  [id number]     Height:       64.0 in Accession #:    [id number]    Weight:       195.0 lb Date of Birth:  1986-04-07     BSA:          1.935 m Patient Age:    37 years      BP:           119/79 mmHg Patient  Gender: F             HR:           78 bpm. Exam Location:  [address]  Procedure: 2D Echo, 3D Echo, Cardiac Doppler, Color Doppler and Strain Analysis  Indications:    R07.89 Chest Pain  History:        Patient has no prior history of Echocardiogram examinations. [redacted] weeks pregnant.  Sonographer:    [name] RCS Referring Phys: [id number] [name]  IMPRESSIONS   1. Left ventricular ejection fraction, by estimation, is 55 to 60%. The left ventricle has normal function. The left ventricle has no regional wall motion abnormalities. Left ventricular diastolic parameters were normal. The average left ventricular global longitudinal strain is -21.2 %. The global longitudinal strain is normal. 2. Right ventricular systolic function is normal. The right ventricular size is normal. Tricuspid regurgitation signal is inadequate for assessing PA pressure. 3. The mitral valve is normal in structure. Trivial mitral valve regurgitation. No evidence of mitral stenosis. 4. The aortic valve is tricuspid. Aortic valve regurgitation is not visualized. No aortic stenosis is present. Normal coronary artery origins. 5. The inferior vena cava is normal in size with greater than 50% respiratory variability, suggesting  right atrial pressure of 3 mmHg.  Conclusion(s)/Recommendation(s): Normal biventricular function without evidence of hemodynamically significant valvular heart disease.  FINDINGS Left Ventricle: Left ventricular ejection fraction, by estimation, is 55 to 60%. The left ventricle has normal function. The left ventricle has no regional wall motion abnormalities. The average left ventricular global longitudinal strain is -21.2 %. The global longitudinal strain is normal. 3D ejection fraction reviewed and evaluated as part of the interpretation. Alternate measurement of EF is felt to be most reflective of LV function. The left ventricular internal cavity size was normal in size. There is no left  ventricular hypertrophy. Left ventricular diastolic parameters were normal.  Right Ventricle: The right ventricular size is normal. No increase in right ventricular wall thickness. Right ventricular systolic function is normal. Tricuspid regurgitation signal is inadequate for assessing PA pressure.  Left Atrium: Left atrial size was normal in size.  Right Atrium: Right atrial size was normal in size.  Pericardium: There is no evidence of pericardial effusion.  Mitral Valve: The mitral valve is normal in structure. Trivial mitral valve regurgitation. No evidence of mitral valve stenosis.  Tricuspid Valve: The tricuspid valve is normal in structure. Tricuspid valve regurgitation is trivial. No evidence of tricuspid stenosis.  Aortic Valve: The aortic valve is tricuspid. Aortic valve regurgitation is not visualized. No aortic stenosis is present. Normal coronary artery origins.  Pulmonic Valve: The pulmonic valve was normal in structure. Pulmonic valve regurgitation is trivial. No evidence of pulmonic stenosis.  Aorta: The aortic root is normal in size and structure.  Venous: The inferior vena cava is normal in size with greater than 50% respiratory variability, suggesting right atrial pressure of 3 mmHg.  IAS/Shunts: No atrial level shunt detected by color flow Doppler.   LEFT VENTRICLE PLAX 2D LVIDd:         4.50 cm   Diastology LVIDs:         2.80 cm   LV e' medial:    9.25 cm/s LV PW:         0.90 cm   LV E/e' medial:  7.8 LV IVS:        0.90 cm   LV e' lateral:   18.90 cm/s LVOT diam:     1.90 cm   LV E/e' lateral: 3.8 LV SV:         73 LV SV Index:   38        2D Longitudinal Strain LVOT Area:     2.84 cm  2D Strain GLS (A2C):   -22.4 % 2D Strain GLS (A3C):   -19.6 % 2D Strain GLS (A4C):   -21.5 % 2D Strain GLS Avg:     -21.2 %  3D Volume EF: 3D EF:        50 % LV EDV:       159 ml LV ESV:       80 ml LV SV:        79 ml  RIGHT VENTRICLE RV Basal diam:  2.80 cm RV S  prime:     13.90 cm/s TAPSE (M-mode): 2.4 cm  LEFT ATRIUM             Index        RIGHT ATRIUM          Index LA diam:        3.90 cm 2.02 cm/m   RA Area:     9.90 cm LA Vol (A2C):   42.4 ml 21.91 ml/m  RA Volume:   20.60 ml 10.64 ml/m LA Vol (A4C):   31.3 ml 16.17 ml/m LA Biplane Vol: 36.6 ml 18.91 ml/m AORTIC VALVE LVOT Vmax:   133.00 cm/s LVOT Vmean:  90.100 cm/s LVOT VTI:    0.259 m  AORTA Ao Root diam: 2.70 cm Ao Asc diam:  2.40 cm  MITRAL VALVE MV Area (PHT):             SHUNTS MV Decel Time:             Systemic VTI:  0.26 m MV E velocity: 72.30 cm/s  Systemic Diam: 1.90 cm MV A velocity: 50.30 cm/s MV E/A ratio:  1.44  Weston Brass MD Electronically signed by Weston Brass MD Signature Date/Time: 11/11/2022/2:11:55 PM    Final              No past medical history on file.  No past surgical history on file. {   OB History     Gravida  1   Para      Term      Preterm      AB      Living         SAB      IAB      Ectopic      Multiple      Live Births              Current Medications: Current Meds  Medication Sig   Prenatal Multivit-Min-Fe-FA (PRE-NATAL FORMULA PO) Take 1 tablet by mouth daily.     Allergies:   Patient has no known allergies.   Social History   Socioeconomic History   Marital status: Married    Spouse name: Not on file   Number of children: Not on file   Years of education: Not on file   Highest education level: Not on file  Occupational History   Not on file  Tobacco Use   Smoking status: Never   Smokeless tobacco: Never  Vaping Use   Vaping Use: Never used  Substance and Sexual Activity   Alcohol use: Not Currently   Drug use: Never   Sexual activity: Not on file  Other Topics Concern   Not on file  Social History Narrative   Not on file   Social Determinants of Health   Financial Resource Strain: Not on file  Food Insecurity: Not on file  Transportation Needs: Not on file   Physical Activity: Not on file  Stress: Not on file  Social Connections: Not on file  {    No family history on file. {   ROS:   Please see the history of present illness.     All other systems reviewed and are negative.   Labs/EKG Reviewed:    EKG:   EKG is not ordered today.   Recent Labs: 11/05/2022: Hemoglobin 10.3; Platelets 301 11/11/2022: BUN 6; Creatinine, Ser 0.56; Magnesium 1.9; Potassium 4.6; Sodium 138   Recent Lipid Panel No results found for: "CHOL", "TRIG", "HDL", "CHOLHDL", "LDLCALC", "LDLDIRECT"  Physical Exam:    VS:  BP 112/64   Pulse 93   Ht 5\' 4"  (1.626 m)   Wt 194 lb 8 oz (88.2 kg)   SpO2 97%   BMI 33.39 kg/m     Wt Readings from Last 3 Encounters:  11/12/22 194 lb 8 oz (88.2 kg)  11/05/22 195 lb (88.5 kg)  10/29/22 194 lb 9.6 oz (88.3 kg)     GEN:  Well nourished, well developed in no acute distress HEENT: Normal NECK: No JVD; No carotid bruits CARDIAC: RRR, no murmurs, rubs, gallops RESPIRATORY:  Clear to auscultation without rales, wheezing or rhonchi  ABDOMEN: Gravid, NTTP MUSCULOSKELETAL:  No edema; No deformity  SKIN: Warm and dry NEUROLOGIC:  Alert and oriented x 3 PSYCHIATRIC:  Normal affect    Risk Assessment/Risk Calculators:     ASSESSMENT & PLAN:    #NVST: -Noted during TTE exam (was not captured but reported by tech and patient had symptoms of palpitations at that time) -Fortunately, has not had any other events that she knows of and was wearing zio at time of event -TTE with normal BiV function and no significant valve disease -K and Mg normal -Will have patient send in zio monitor for further evaluation -Given reassuring echo, normal electrolytes and isolated episode, will plan to follow-up cardiac monitor at this time. If increased ectopy on monitor or recurrent event, can check ETT at that time -Will arrange for closer follow-up with Dr. Servando Harrison  #Chest Pain: #Lightheadedness: #SOB: -Symptoms have been ongoing  for several weeks and are increasing in frequency and duration with pregnancy -Fortunately, symptoms sound atypical and TTE reassuring with normal BiV function and no significant valve disease -As above, will follow-up cardiac monitor and if concerning, can proceed with ETT at that time   Patient Instructions  Medication Instructions:   Your physician recommends that you continue on your current medications as directed. Please refer to the Current Medication list given to you today.  *If you need a refill on your cardiac medications before your next appointment, please call your pharmacy*    Follow-Up:  2-4 WEEKS WITH DR. TOBB EITHER HERE  AT [facility] CL[facility]IC OR [facility]   Other Instructions  PER DR. Shari Prows, PLEASE GO AHEAD AND SEND IN YOUR HEART MONITOR    Dispo:  No follow-ups on file.   Medication Adjustments/Labs and Tests Ordered: Current medicines are reviewed at length with the patient today.  Concerns regarding medicines are outlined above.  Tests Ordered: No orders of the defined types were placed in this encounter.  Medication Changes: No orders of the defined types were placed in this encounter.

## 2022-11-12 ENCOUNTER — Ambulatory Visit (INDEPENDENT_AMBULATORY_CARE_PROVIDER_SITE_OTHER): Payer: BC Managed Care – PPO | Admitting: Cardiology

## 2022-11-12 ENCOUNTER — Encounter: Payer: Self-pay | Admitting: Cardiology

## 2022-11-12 VITALS — BP 112/64 | HR 93 | Ht 64.0 in | Wt 194.5 lb

## 2022-11-12 DIAGNOSIS — R42 Dizziness and giddiness: Secondary | ICD-10-CM

## 2022-11-12 DIAGNOSIS — I4729 Other ventricular tachycardia: Secondary | ICD-10-CM

## 2022-11-12 DIAGNOSIS — R0789 Other chest pain: Secondary | ICD-10-CM | POA: Diagnosis not present

## 2022-11-12 LAB — BASIC METABOLIC PANEL
BUN/Creatinine Ratio: 11 (ref 9–23)
BUN: 6 mg/dL (ref 6–20)
CO2: 24 mmol/L (ref 20–29)
Calcium: 9.1 mg/dL (ref 8.7–10.2)
Chloride: 101 mmol/L (ref 96–106)
Creatinine, Ser: 0.56 mg/dL — ABNORMAL LOW (ref 0.57–1.00)
Glucose: 77 mg/dL (ref 70–99)
Potassium: 4.6 mmol/L (ref 3.5–5.2)
Sodium: 138 mmol/L (ref 134–144)
eGFR: 121 mL/min/{1.73_m2} (ref >59)

## 2022-11-12 LAB — MAGNESIUM: Magnesium: 1.9 mg/dL (ref 1.6–2.3)

## 2022-11-12 NOTE — Patient Instructions (Signed)
Medication Instructions:   Your physician recommends that you continue on your current medications as directed. Please refer to the Current Medication list given to you today.  *If you need a refill on your cardiac medications before your next appointment, please call your pharmacy*    Follow-Up:  2-4 WEEKS WITH DR. TOBB EITHER HERE  AT [facility] CLINIC OR [facility]   Other Instructions  PER DR. Shari Prows, PLEASE GO AHEAD AND SEND IN YOUR HEART MONITOR

## 2022-12-01 ENCOUNTER — Telehealth: Payer: Self-pay | Admitting: Cardiology

## 2022-12-01 NOTE — Telephone Encounter (Signed)
Patient states received call last night regarding virtual appt. With Dr Servando Salina.. Only saw OV to F/U in 2 weeks in office.  If she is to have virtual please call thank you

## 2022-12-01 NOTE — Telephone Encounter (Signed)
Patient is requesting call back to set up virtual appt per Dr. Servando Salina.

## 2022-12-01 NOTE — Telephone Encounter (Signed)
Called pt. MyChart appt set up.

## 2022-12-06 ENCOUNTER — Ambulatory Visit: Payer: BC Managed Care – PPO | Attending: Cardiology | Admitting: Cardiology

## 2022-12-06 VITALS — BP 114/77 | HR 87 | Ht 64.0 in | Wt 194.0 lb

## 2022-12-06 DIAGNOSIS — R0609 Other forms of dyspnea: Secondary | ICD-10-CM

## 2022-12-06 DIAGNOSIS — I4729 Other ventricular tachycardia: Secondary | ICD-10-CM

## 2022-12-06 NOTE — Patient Instructions (Signed)
Medication Instructions:  Your physician recommends that you continue on your current medications as directed. Please refer to the Current Medication list given to you today.  *If you need a refill on your cardiac medications before your next appointment, please call your pharmacy*   Lab Work: None   Testing/Procedures: None   Follow-Up: At [facility], you and your health needs are our priority.  As part of our continuing mission to provide you with exceptional heart care, we have created designated Provider Care Teams.  These Care Teams include your primary Cardiologist (physician) and Advanced Practice Providers (APPs -  Physician Assistants and Nurse Practitioners) who all work together to provide you with the care you need, when you need it.  Your next appointment:   4 week(s)  Provider:   Kardie Tobb, DO  

## 2022-12-06 NOTE — Progress Notes (Signed)
Cardio-Obstetrics Clinic  Follow Up Note   Date:  12/06/2022   ID:  [name], DOB 1985-09-29, MRN [id number]  PCP:  Pcp, No   [facility] Providers Cardiologist:  Thomasene Ripple, DO  Electrophysiologist:  None       Patient is at home. I am in the office  Virtual Visit via Video  Note . I connected with the patient today by a   video enabled telemedicine application and verified that I am speaking with the correct person using two identifiers.   Referring MD: No ref. provider found   Chief Complaint: " I am ok,with intermittent shortness of breath"  History of Present Illness:    [name] is a 37 y.o. female [G1P0] who returns for follow up of monitor discussion.  She is currently [redacted] weeks pregnant.  Medical history recent intermittent NSVT, PSVT.  I initially saw the patient on Oct 29, 2022 at that time she presented for intermittent palpitation shortness of breath.    The patient for an echocardiogram as she was experiencing atypical chest pain.   In the interim while get an echocardiogram there was noted short run of NSVT she was seen by Dr. Shari Prows at 27 weeks 3 days.  At that experiencing the chest heaviness and noted lightheadedness and some shortness of breath.  Her echocardiogram was normal.  No valvular abnormalities no wall motion abnormalities.  She had her monitor which recently showed 1 of NSVT and 1 episode of PSVT with variable block all occurring in June.  Chest heart complains of intermittent shortness of breath only on exertion especially when climbing up stairs as she lives in [place] home.  No episodes of hypotension since her echocardiogram.  No chest pain.   Prior CV Studies Reviewed: The following studies were reviewed today:  TTE 10/2022 IMPRESSIONS     1. Left ventricular ejection fraction, by estimation, is 55 to 60%. The  left ventricle has normal function. The left ventricle has no regional  wall motion abnormalities.  Left ventricular diastolic parameters were  normal. The average left ventricular  global longitudinal strain is -21.2 %. The global longitudinal strain is  normal.   2. Right ventricular systolic function is normal. The right ventricular  size is normal. Tricuspid regurgitation signal is inadequate for assessing  PA pressure.   3. The mitral valve is normal in structure. Trivial mitral valve  regurgitation. No evidence of mitral stenosis.   4. The aortic valve is tricuspid. Aortic valve regurgitation is not  visualized. No aortic stenosis is present. Normal coronary artery origins.   5. The inferior vena cava is normal in size with greater than 50%  respiratory variability, suggesting right atrial pressure of 3 mmHg.   Conclusion(s)/Recommendation(s): Normal biventricular function without  evidence of hemodynamically significant valvular heart disease.   FINDINGS   Left Ventricle: Left ventricular ejection fraction, by estimation, is 55  to 60%. The left ventricle has normal function. The left ventricle has no  regional wall motion abnormalities. The average left ventricular global  longitudinal strain is -21.2 %.  The global longitudinal strain is normal. 3D ejection fraction reviewed  and evaluated as part of the interpretation. Alternate measurement of EF  is felt to be most reflective of LV function. The left ventricular  internal cavity size was normal in size.  There is no left ventricular hypertrophy. Left ventricular diastolic  parameters were normal.   Right Ventricle: The right ventricular size is normal. No increase in  right  ventricular wall thickness. Right ventricular systolic function is  normal. Tricuspid regurgitation signal is inadequate for assessing PA  pressure.   Left Atrium: Left atrial size was normal in size.   Right Atrium: Right atrial size was normal in size.   Pericardium: There is no evidence of pericardial effusion.   Mitral Valve: The mitral  valve is normal in structure. Trivial mitral  valve regurgitation. No evidence of mitral valve stenosis.   Tricuspid Valve: The tricuspid valve is normal in structure. Tricuspid  valve regurgitation is trivial. No evidence of tricuspid stenosis.   Aortic Valve: The aortic valve is tricuspid. Aortic valve regurgitation is  not visualized. No aortic stenosis is present. Normal coronary artery  origins.   Pulmonic Valve: The pulmonic valve was normal in structure. Pulmonic valve  regurgitation is trivial. No evidence of pulmonic stenosis.   Aorta: The aortic root is normal in size and structure.   Venous: The inferior vena cava is normal in size with greater than 50%  respiratory variability, suggesting right atrial pressure of 3 mmHg.   IAS/Shunts: No atrial level shunt detected by color flow Doppler.   Zio monitor 11/23/2022 Patch Wear Time:  9 days and 23 hours (2024-06-04T21:59:47-0400 to 2024-06-14T21:24:42-0400)   Patient had a min HR of 56 bpm, max HR of 218 bpm, and avg HR of 87 bpm.  Predominant underlying rhythm was Sinus Rhythm. Slight P wave morphology changes were noted.    2 Ventricular Tachycardia runs occurred, the run with the fastest interval lasting 5 beats with a max rate of 218 bpm, the longest lasting 13 beats with an avg rate of 168 bpm.    1 run of Supraventricular Tachycardia occurred lasting 16 beats with a max rate of 135 bpm (avg 119 bpm).    Isolated SVEs were rare (<1.0%), and no SVE Couplets or SVE Triplets were present. Isolated VEs were rare (<1.0%), VE Couplets were rare (<1.0%), and no VE Triplets were present.   Symptoms associated with premature ventricular complex.   Conclusion: This study shows the following:                       1.  Asymptomatic nonsustained ventricular tachycardia (2 episodes).                       2.  Rare paroxysmal supraventricular tachycardia which is likely atrial tachycardia with variable block.   No past medical  history on file.  No past surgical history on file.    OB History     Gravida  1   Para      Term      Preterm      AB      Living         SAB      IAB      Ectopic      Multiple      Live Births                  Current Medications: Current Meds  Medication Sig   Prenatal Multivit-Min-Fe-FA (PRE-NATAL FORMULA PO) Take 1 tablet by mouth daily.     Allergies:   Patient has no known allergies.   Social History   Socioeconomic History   Marital status: Married    Spouse name: Not on file   Number of children: Not on file   Years of education: Not on file   Highest education level: Not  on file  Occupational History   Not on file  Tobacco Use   Smoking status: Never   Smokeless tobacco: Never  Vaping Use   Vaping Use: Never used  Substance and Sexual Activity   Alcohol use: Not Currently   Drug use: Never   Sexual activity: Not on file  Other Topics Concern   Not on file  Social History Narrative   Not on file   Social Determinants of Health   Financial Resource Strain: Not on file  Food Insecurity: Not on file  Transportation Needs: Not on file  Physical Activity: Not on file  Stress: Not on file  Social Connections: Not on file      No family history on file.    ROS:   Please see the history of present illness.    Intermittent shortness of breath All other systems reviewed and are negative.   Labs/EKG Reviewed:    EKG:   EKG is was not ordered today.    Recent Labs: 11/05/2022: Hemoglobin 10.3; Platelets 301 11/11/2022: BUN 6; Creatinine, Ser 0.56; Magnesium 1.9; Potassium 4.6; Sodium 138   Recent Lipid Panel No results found for: "CHOL", "TRIG", "HDL", "CHOLHDL", "LDLCALC", "LDLDIRECT"  Physical Exam:    VS:  BP 114/77   Pulse 87   Ht 5\' 4"  (1.626 m)   Wt 194 lb (88 kg)   BMI 33.30 kg/m     Wt Readings from Last 3 Encounters:  12/06/22 194 lb (88 kg)  11/12/22 194 lb 8 oz (88.2 kg)  11/05/22 195 lb (88.5 kg)        Risk Assessment/Risk Calculators:     CARPREG II Risk Prediction Index Score:  1.  The patient's risk for a primary cardiac event is 5%.            ASSESSMENT & PLAN:    NSVT Atypical chest pain Lightheadedness Shortness of breath Advanced maternal age  We discussed her monitor results which showed 2 episodes of NSVT, she is asymptomatic, very short runs.  Shared decision we will monitor the patient closely if she develops worsening lightheadedness and palpitation we will start low-dose beta-blocker.  Will increase Aktipak in addition we discussed her echocardiogram which is normal.  Shortness of breath based on clinical description is in the setting of her pregnancy.  No warning signs at this time.  As noted above echocardiogram has been normal.  Follow-up in 4 weeks.  Patient Instructions  Medication Instructions:  Your physician recommends that you continue on your current medications as directed. Please refer to the Current Medication list given to you today.  *If you need a refill on your cardiac medications before your next appointment, please call your pharmacy*   Lab Work: None   Testing/Procedures: None   Follow-Up: At [facility], you and your health needs are our priority.  As part of our continuing mission to provide you with exceptional heart care, we have created designated Provider Care Teams.  These Care Teams include your primary Cardiologist (physician) and Advanced Practice Providers (APPs -  Physician Assistants and Nurse Practitioners) who all work together to provide you with the care you need, when you need it.  Your next appointment:   4 week(s)  Provider:   Thomasene Ripple, DO    Dispo:  No follow-ups on file.   Medication Adjustments/Labs and Tests Ordered: Current medicines are reviewed at length with the patient today.  Concerns regarding medicines are outlined above.  Tests Ordered: No orders  of the defined types were  placed in this encounter.  Medication Changes: No orders of the defined types were placed in this encounter.

## 2022-12-17 ENCOUNTER — Ambulatory Visit: Payer: BC Managed Care – PPO | Admitting: Cardiology

## 2023-01-06 ENCOUNTER — Encounter: Payer: Self-pay | Admitting: Cardiology

## 2023-01-06 ENCOUNTER — Ambulatory Visit: Payer: BC Managed Care – PPO | Attending: Cardiology | Admitting: Cardiology

## 2023-01-06 VITALS — BP 128/74 | HR 90 | Ht 64.0 in | Wt 202.0 lb

## 2023-01-06 DIAGNOSIS — I4729 Other ventricular tachycardia: Secondary | ICD-10-CM | POA: Diagnosis not present

## 2023-01-06 DIAGNOSIS — I471 Supraventricular tachycardia, unspecified: Secondary | ICD-10-CM

## 2023-01-06 NOTE — Progress Notes (Signed)
Cardio-Obstetrics Clinic  Follow Up Note   Date:  01/06/2023   ID:  [name], DOB November 03, 1985, MRN [id number]  PCP:  Aviva Kluver   [facility] Providers Cardiologist:  Thomasene Ripple, DO  Electrophysiologist:  None        Referring MD: No ref. provider found   Chief Complaint: " I am doing better"  History of Present Illness:    [name] is a 37 y.o. female [G1P0] who returns for follow up of NSVT, PSVT here today for a follow up visit. At her last visit we discuss her monitor and held off on starting medication.   She is doing well from a palpitation stand point.. No complaints at this time.    Prior CV Studies Reviewed: The following studies were reviewed today:    TTE 10/2022 IMPRESSIONS     1. Left ventricular ejection fraction, by estimation, is 55 to 60%. The  left ventricle has normal function. The left ventricle has no regional  wall motion abnormalities. Left ventricular diastolic parameters were  normal. The average left ventricular  global longitudinal strain is -21.2 %. The global longitudinal strain is  normal.   2. Right ventricular systolic function is normal. The right ventricular  size is normal. Tricuspid regurgitation signal is inadequate for assessing  PA pressure.   3. The mitral valve is normal in structure. Trivial mitral valve  regurgitation. No evidence of mitral stenosis.   4. The aortic valve is tricuspid. Aortic valve regurgitation is not  visualized. No aortic stenosis is present. Normal coronary artery origins.   5. The inferior vena cava is normal in size with greater than 50%  respiratory variability, suggesting right atrial pressure of 3 mmHg.   Conclusion(s)/Recommendation(s): Normal biventricular function without  evidence of hemodynamically significant valvular heart disease.   FINDINGS   Left Ventricle: Left ventricular ejection fraction, by estimation, is 55  to 60%. The left ventricle has normal function. The left  ventricle has no  regional wall motion abnormalities. The average left ventricular global  longitudinal strain is -21.2 %.  The global longitudinal strain is normal. 3D ejection fraction reviewed  and evaluated as part of the interpretation. Alternate measurement of EF  is felt to be most reflective of LV function. The left ventricular  internal cavity size was normal in size.  There is no left ventricular hypertrophy. Left ventricular diastolic  parameters were normal.   Right Ventricle: The right ventricular size is normal. No increase in  right ventricular wall thickness. Right ventricular systolic function is  normal. Tricuspid regurgitation signal is inadequate for assessing PA  pressure.   Left Atrium: Left atrial size was normal in size.   Right Atrium: Right atrial size was normal in size.   Pericardium: There is no evidence of pericardial effusion.   Mitral Valve: The mitral valve is normal in structure. Trivial mitral  valve regurgitation. No evidence of mitral valve stenosis.   Tricuspid Valve: The tricuspid valve is normal in structure. Tricuspid  valve regurgitation is trivial. No evidence of tricuspid stenosis.   Aortic Valve: The aortic valve is tricuspid. Aortic valve regurgitation is  not visualized. No aortic stenosis is present. Normal coronary artery  origins.   Pulmonic Valve: The pulmonic valve was normal in structure. Pulmonic valve  regurgitation is trivial. No evidence of pulmonic stenosis.   Aorta: The aortic root is normal in size and structure.   Venous: The inferior vena cava is normal in size with greater than 50%  respiratory variability, suggesting right atrial pressure of 3 mmHg.   IAS/Shunts: No atrial level shunt detected by color flow Doppler.    Zio monitor 11/23/2022 Patch Wear Time:  9 days and 23 hours (2024-06-04T21:59:47-0400 to 2024-06-14T21:24:42-0400)   Patient had a min HR of 56 bpm, max HR of 218 bpm, and avg HR of 87 bpm.   Predominant underlying rhythm was Sinus Rhythm. Slight P wave morphology changes were noted.    2 Ventricular Tachycardia runs occurred, the run with the fastest interval lasting 5 beats with a max rate of 218 bpm, the longest lasting 13 beats with an avg rate of 168 bpm.    1 run of Supraventricular Tachycardia occurred lasting 16 beats with a max rate of 135 bpm (avg 119 bpm).    Isolated SVEs were rare (<1.0%), and no SVE Couplets or SVE Triplets were present. Isolated VEs were rare (<1.0%), VE Couplets were rare (<1.0%), and no VE Triplets were present.   Symptoms associated with premature ventricular complex.   Conclusion: This study shows the following:                       1.  Asymptomatic nonsustained ventricular tachycardia (2 episodes).                       2.  Rare paroxysmal supraventricular tachycardia which is likely atrial tachycardia with variable block.   No past medical history on file.  No past surgical history on file.    OB History     Gravida  1   Para      Term      Preterm      AB      Living         SAB      IAB      Ectopic      Multiple      Live Births                  Current Medications: No outpatient medications have been marked as taking for the 01/06/23 encounter (Office Visit) with Thomasene Ripple, DO.     Allergies:   Patient has no known allergies.   Social History   Socioeconomic History   Marital status: Married    Spouse name: Not on file   Number of children: Not on file   Years of education: Not on file   Highest education level: Not on file  Occupational History   Not on file  Tobacco Use   Smoking status: Never   Smokeless tobacco: Never  Vaping Use   Vaping status: Never Used  Substance and Sexual Activity   Alcohol use: Not Currently   Drug use: Never   Sexual activity: Not on file  Other Topics Concern   Not on file  Social History Narrative   Not on file   Social Determinants of Health    Financial Resource Strain: Not on file  Food Insecurity: Not on file  Transportation Needs: Not on file  Physical Activity: Not on file  Stress: Not on file  Social Connections: Unknown (04/05/2022)   Received from [facility]   Social Network    Social Network: Not on file      No family history on file.    ROS:   Please see the history of present illness.     All other systems reviewed and are negative.   Labs/EKG Reviewed:  EKG:   EKG was ordered today.    Recent Labs: 11/05/2022: Hemoglobin 10.3; Platelets 301 11/11/2022: BUN 6; Creatinine, Ser 0.56; Magnesium 1.9; Potassium 4.6; Sodium 138   Recent Lipid Panel No results found for: "CHOL", "TRIG", "HDL", "CHOLHDL", "LDLCALC", "LDLDIRECT"  Physical Exam:    VS:  BP 128/74 (BP Location: Right Arm, Patient Position: Sitting, Cuff Size: Normal)   Pulse 90   Ht 5\' 4"  (1.626 m)   Wt 202 lb (91.6 kg)   SpO2 98%   BMI 34.67 kg/m     Wt Readings from Last 3 Encounters:  01/06/23 202 lb (91.6 kg)  12/06/22 194 lb (88 kg)  11/12/22 194 lb 8 oz (88.2 kg)     GEN:  Well nourished, well developed in no acute distress HEENT: Normal NECK: No JVD; No carotid bruits LYMPHATICS: No lymphadenopathy CARDIAC: RRR, no murmurs, rubs, gallops RESPIRATORY:  Clear to auscultation without rales, wheezing or rhonchi  ABDOMEN: Soft, non-tender, non-distended MUSCULOSKELETAL:  No edema; No deformity  SKIN: Warm and dry NEUROLOGIC:  Alert and oriented x 3 PSYCHIATRIC:  Normal affect    Risk Assessment/Risk Calculators:     CARPREG II Risk Prediction Index Score:  1.  The patient's risk for a primary cardiac event is 5%.            ASSESSMENT & PLAN:    NSVT PSVT  Symptoms have improved, no need for adding medication at this time.  I have asked the patient to continue to take her blood pressure esp post partum and notify my office if >140/6mmHg.   Will she post partum day 5 virtually..   Patient Instructions   Medication Instructions:  Your physician recommends that you continue on your current medications as directed. Please refer to the Current Medication list given to you today.  *If you need a refill on your cardiac medications before your next appointment, please call your pharmacy*   Lab Work: None   Testing/Procedures: None   Follow-Up: At [facility], you and your health needs are our priority.  As part of our continuing mission to provide you with exceptional heart care, we have created designated Provider Care Teams.  These Care Teams include your primary Cardiologist (physician) and Advanced Practice Providers (APPs -  Physician Assistants and Nurse Practitioners) who all work together to provide you with the care you need, when you need it.   Your next appointment:    Sept 16th at 11:20am  Provider:   Thomasene Ripple, DO    Dispo:  No follow-ups on file.   Medication Adjustments/Labs and Tests Ordered: Current medicines are reviewed at length with the patient today.  Concerns regarding medicines are outlined above.  Tests Ordered: No orders of the defined types were placed in this encounter.  Medication Changes: No orders of the defined types were placed in this encounter.

## 2023-01-06 NOTE — Patient Instructions (Signed)
Medication Instructions:  Your physician recommends that you continue on your current medications as directed. Please refer to the Current Medication list given to you today.  *If you need a refill on your cardiac medications before your next appointment, please call your pharmacy*   Lab Work: None   Testing/Procedures: None   Follow-Up: At [facility], you and your health needs are our priority.  As part of our continuing mission to provide you with exceptional heart care, we have created designated Provider Care Teams.  These Care Teams include your primary Cardiologist (physician) and Advanced Practice Providers (APPs -  Physician Assistants and Nurse Practitioners) who all work together to provide you with the care you need, when you need it.   Your next appointment:    Sept 16th at 11:20am  Provider:   Thomasene Ripple, DO

## 2023-01-24 ENCOUNTER — Ambulatory Visit: Payer: 59 | Admitting: Cardiology

## 2023-01-26 ENCOUNTER — Other Ambulatory Visit: Payer: Self-pay

## 2023-01-26 ENCOUNTER — Encounter (HOSPITAL_COMMUNITY)
Admission: AD | Disposition: A | Discharge status: Home / Self Care | Payer: Self-pay | Source: Home / Self Care | Attending: Obstetrics and Gynecology

## 2023-01-26 ENCOUNTER — Inpatient Hospital Stay (HOSPITAL_COMMUNITY): Payer: BC Managed Care – PPO | Admitting: Anesthesiology

## 2023-01-26 ENCOUNTER — Encounter (HOSPITAL_COMMUNITY): Payer: Self-pay | Admitting: Obstetrics and Gynecology

## 2023-01-26 ENCOUNTER — Inpatient Hospital Stay (HOSPITAL_COMMUNITY)
Admission: AD | Admit: 2023-01-26 | Discharge: 2023-01-28 | Disposition: A | Discharge status: Home / Self Care | Payer: BC Managed Care – PPO | Source: Home / Self Care | Attending: Obstetrics and Gynecology | Admitting: Obstetrics and Gynecology

## 2023-01-26 DIAGNOSIS — Z3490 Encounter for supervision of normal pregnancy, unspecified, unspecified trimester: Principal | ICD-10-CM

## 2023-01-26 DIAGNOSIS — O9081 Anemia of the puerperium: Secondary | ICD-10-CM | POA: Diagnosis not present

## 2023-01-26 DIAGNOSIS — O34211 Maternal care for low transverse scar from previous cesarean delivery: Secondary | ICD-10-CM | POA: Diagnosis present

## 2023-01-26 DIAGNOSIS — O99824 Streptococcus B carrier state complicating childbirth: Secondary | ICD-10-CM | POA: Diagnosis present

## 2023-01-26 DIAGNOSIS — O99820 Streptococcus B carrier state complicating pregnancy: Secondary | ICD-10-CM | POA: Diagnosis not present

## 2023-01-26 DIAGNOSIS — O99214 Obesity complicating childbirth: Secondary | ICD-10-CM | POA: Diagnosis present

## 2023-01-26 DIAGNOSIS — Z3A37 37 weeks gestation of pregnancy: Secondary | ICD-10-CM | POA: Diagnosis not present

## 2023-01-26 HISTORY — DX: Other specified health status: Z78.9

## 2023-01-26 LAB — CBC
HCT: 35.1 % — ABNORMAL LOW (ref 36.0–46.0)
HCT: 31 % — ABNORMAL LOW (ref 36.0–46.0)
Hemoglobin: 10.8 g/dL — ABNORMAL LOW (ref 12.0–15.0)
Hemoglobin: 9.5 g/dL — ABNORMAL LOW (ref 12.0–15.0)
MCH: 25 pg — ABNORMAL LOW (ref 26.0–34.0)
MCH: 24.4 pg — ABNORMAL LOW (ref 26.0–34.0)
MCHC: 30.8 g/dL (ref 30.0–36.0)
MCHC: 30.6 g/dL (ref 30.0–36.0)
MCV: 81.3 fL (ref 80.0–100.0)
MCV: 79.7 fL — ABNORMAL LOW (ref 80.0–100.0)
Platelets: 268 10*3/uL (ref 150–400)
Platelets: 234 10*3/uL (ref 150–400)
RBC: 4.32 MIL/uL (ref 3.87–5.11)
RBC: 3.89 MIL/uL (ref 3.87–5.11)
RDW: 14.9 % (ref 11.5–15.5)
RDW: 14.8 % (ref 11.5–15.5)
WBC: 11.6 10*3/uL — ABNORMAL HIGH (ref 4.0–10.5)
WBC: 16 10*3/uL — ABNORMAL HIGH (ref 4.0–10.5)
nRBC: 0 % (ref 0.0–0.2)
nRBC: 0 % (ref 0.0–0.2)

## 2023-01-26 LAB — POCT FERN TEST: POCT Fern Test: POSITIVE

## 2023-01-26 LAB — RPR: RPR Ser Ql: NONREACTIVE

## 2023-01-26 LAB — TYPE AND SCREEN
ABO/RH(D): A POS
Antibody Screen: NEGATIVE

## 2023-01-26 SURGERY — Surgical Case
Anesthesia: Spinal | Site: Abdomen

## 2023-01-26 MED ORDER — BUPIVACAINE IN DEXTROSE 0.75-8.25 % IT SOLN
INTRATHECAL | Status: DC | PRN
  Administered 2023-01-26: 1.5 mL via INTRATHECAL

## 2023-01-26 MED ORDER — DEXAMETHASONE SODIUM PHOSPHATE 4 MG/ML IJ SOLN
INTRAMUSCULAR | Status: AC
  Filled 2023-01-26: qty 1

## 2023-01-26 MED ORDER — DIPHENHYDRAMINE HCL 25 MG PO CAPS: 25.0000 mg | ORAL_CAPSULE | ORAL | Status: DC | PRN

## 2023-01-26 MED ORDER — SOD CITRATE-CITRIC ACID 500-334 MG/5ML PO SOLN: 30.0000 mL | ORAL | Status: DC

## 2023-01-26 MED ORDER — MENTHOL 3 MG MT LOZG: 1.0000 | LOZENGE | OROMUCOSAL | Status: DC | PRN

## 2023-01-26 MED ORDER — MEPERIDINE HCL 25 MG/ML IJ SOLN: 6.2500 mg | INTRAMUSCULAR | Status: DC | PRN

## 2023-01-26 MED ORDER — OXYTOCIN-SODIUM CHLORIDE 30-0.9 UT/500ML-% IV SOLN
INTRAVENOUS | Status: DC | PRN
  Administered 2023-01-26: 30 [IU] via INTRAVENOUS

## 2023-01-26 MED ORDER — DEXAMETHASONE SODIUM PHOSPHATE 4 MG/ML IJ SOLN
INTRAMUSCULAR | Status: DC | PRN
Start: 2023-01-26 — End: 2023-01-26
  Administered 2023-01-26: 4 mg via INTRAVENOUS

## 2023-01-26 MED ORDER — DEXMEDETOMIDINE HCL IN NACL 80 MCG/20ML IV SOLN
INTRAVENOUS | Status: AC
  Filled 2023-01-26: qty 20

## 2023-01-26 MED ORDER — NALOXONE HCL 4 MG/10ML IJ SOLN: 1.0000 ug/kg/h | INTRAVENOUS | Status: DC | PRN

## 2023-01-26 MED ORDER — ONDANSETRON HCL 4 MG/2ML IJ SOLN: 4.0000 mg | Freq: Once | INTRAMUSCULAR | Status: DC | PRN

## 2023-01-26 MED ORDER — LACTATED RINGERS IV SOLN: INTRAVENOUS | Status: DC | PRN

## 2023-01-26 MED ORDER — ONDANSETRON HCL 4 MG/2ML IJ SOLN: 4.0000 mg | Freq: Three times a day (TID) | INTRAMUSCULAR | Status: DC | PRN

## 2023-01-26 MED ORDER — DIPHENHYDRAMINE HCL 50 MG/ML IJ SOLN: 12.5000 mg | INTRAMUSCULAR | Status: DC | PRN

## 2023-01-26 MED ORDER — OXYCODONE HCL 5 MG PO TABS: 5.0000 mg | ORAL_TABLET | ORAL | Status: DC | PRN

## 2023-01-26 MED ORDER — LACTATED RINGERS IV SOLN: INTRAVENOUS | Status: DC

## 2023-01-26 MED ORDER — DIPHENHYDRAMINE HCL 50 MG/ML IJ SOLN
INTRAMUSCULAR | Status: DC | PRN
  Administered 2023-01-26: 25 mg via INTRAVENOUS

## 2023-01-26 MED ORDER — AMISULPRIDE (ANTIEMETIC) 5 MG/2ML IV SOLN: 10.0000 mg | Freq: Once | INTRAVENOUS | Status: DC | PRN

## 2023-01-26 MED ORDER — MORPHINE SULFATE (PF) 0.5 MG/ML IJ SOLN
INTRAMUSCULAR | Status: DC | PRN
  Administered 2023-01-26: .15 mg via INTRATHECAL

## 2023-01-26 MED ORDER — SODIUM CHLORIDE 0.9% FLUSH: 3.0000 mL | INTRAVENOUS | Status: DC | PRN

## 2023-01-26 MED ORDER — CEFAZOLIN SODIUM-DEXTROSE 2-4 GM/100ML-% IV SOLN
2.0000 g | INTRAVENOUS | Status: AC
  Administered 2023-01-26: 2 g via INTRAVENOUS

## 2023-01-26 MED ORDER — SODIUM CHLORIDE 0.9 % IV SOLN
500.0000 mg | INTRAVENOUS | Status: AC
  Administered 2023-01-26: 500 mg via INTRAVENOUS
  Filled 2023-01-26: qty 5

## 2023-01-26 MED ORDER — METOCLOPRAMIDE HCL 5 MG/ML IJ SOLN
INTRAMUSCULAR | Status: DC | PRN
Start: 2023-01-26 — End: 2023-01-26
  Administered 2023-01-26: 10 mg via INTRAVENOUS

## 2023-01-26 MED ORDER — OXYTOCIN-SODIUM CHLORIDE 30-0.9 UT/500ML-% IV SOLN: 2.5000 [IU]/h | INTRAVENOUS | Status: AC

## 2023-01-26 MED ORDER — PHENYLEPHRINE 80 MCG/ML (10ML) SYRINGE FOR IV PUSH (FOR BLOOD PRESSURE SUPPORT)
PREFILLED_SYRINGE | INTRAVENOUS | Status: AC
  Filled 2023-01-26: qty 10

## 2023-01-26 MED ORDER — COCONUT OIL OIL: 1.0000 | TOPICAL_OIL | Status: DC | PRN

## 2023-01-26 MED ORDER — NALOXONE HCL 0.4 MG/ML IJ SOLN: 0.4000 mg | INTRAMUSCULAR | Status: DC | PRN

## 2023-01-26 MED ORDER — OXYTOCIN-SODIUM CHLORIDE 30-0.9 UT/500ML-% IV SOLN
INTRAVENOUS | Status: AC
  Filled 2023-01-26: qty 500

## 2023-01-26 MED ORDER — DIBUCAINE (PERIANAL) 1 % EX OINT: 1.0000 | TOPICAL_OINTMENT | CUTANEOUS | Status: DC | PRN

## 2023-01-26 MED ORDER — SODIUM CHLORIDE 0.9 % IV SOLN: INTRAVENOUS | Status: DC | PRN

## 2023-01-26 MED ORDER — MORPHINE SULFATE (PF) 0.5 MG/ML IJ SOLN
INTRAMUSCULAR | Status: AC
  Filled 2023-01-26: qty 10

## 2023-01-26 MED ORDER — LACTATED RINGERS IV BOLUS
1000.0000 mL | Freq: Once | INTRAVENOUS | Status: AC
  Administered 2023-01-26: 1000 mL via INTRAVENOUS

## 2023-01-26 MED ORDER — POLYSACCHARIDE IRON COMPLEX 150 MG PO CAPS
150.0000 mg | ORAL_CAPSULE | Freq: Every day | ORAL | Status: DC
  Administered 2023-01-26 – 2023-01-28 (×3): 150 mg via ORAL
  Filled 2023-01-26 (×3): qty 1

## 2023-01-26 MED ORDER — KETOROLAC TROMETHAMINE 30 MG/ML IJ SOLN
30.0000 mg | Freq: Four times a day (QID) | INTRAMUSCULAR | Status: AC | PRN
  Administered 2023-01-26: 30 mg via INTRAMUSCULAR

## 2023-01-26 MED ORDER — ZOLPIDEM TARTRATE 5 MG PO TABS: 5.0000 mg | ORAL_TABLET | Freq: Every evening | ORAL | Status: DC | PRN

## 2023-01-26 MED ORDER — SCOPOLAMINE 1 MG/3DAYS TD PT72: 1.0000 | MEDICATED_PATCH | Freq: Once | TRANSDERMAL | Status: DC

## 2023-01-26 MED ORDER — SIMETHICONE 80 MG PO CHEW: 80.0000 mg | CHEWABLE_TABLET | ORAL | Status: DC | PRN

## 2023-01-26 MED ORDER — IBUPROFEN 600 MG PO TABS
600.0000 mg | ORAL_TABLET | Freq: Four times a day (QID) | ORAL | Status: DC
  Administered 2023-01-26 – 2023-01-28 (×9): 600 mg via ORAL
  Filled 2023-01-26 (×9): qty 1

## 2023-01-26 MED ORDER — ONDANSETRON HCL 4 MG/2ML IJ SOLN
INTRAMUSCULAR | Status: DC | PRN
  Administered 2023-01-26: 4 mg via INTRAVENOUS

## 2023-01-26 MED ORDER — FENTANYL CITRATE (PF) 100 MCG/2ML IJ SOLN
INTRAMUSCULAR | Status: DC | PRN
  Administered 2023-01-26: 15 ug via INTRATHECAL

## 2023-01-26 MED ORDER — SODIUM CHLORIDE 0.9 % IR SOLN
Status: DC | PRN
  Administered 2023-01-26: 1

## 2023-01-26 MED ORDER — ACETAMINOPHEN 325 MG PO TABS: 650.0000 mg | ORAL_TABLET | ORAL | Status: DC | PRN

## 2023-01-26 MED ORDER — FENTANYL CITRATE (PF) 100 MCG/2ML IJ SOLN
INTRAMUSCULAR | Status: AC
  Filled 2023-01-26: qty 2

## 2023-01-26 MED ORDER — SIMETHICONE 80 MG PO CHEW
80.0000 mg | CHEWABLE_TABLET | Freq: Three times a day (TID) | ORAL | Status: DC
  Administered 2023-01-26 – 2023-01-28 (×6): 80 mg via ORAL
  Filled 2023-01-26 (×6): qty 1

## 2023-01-26 MED ORDER — FAMOTIDINE IN NACL 20-0.9 MG/50ML-% IV SOLN
20.0000 mg | Freq: Once | INTRAVENOUS | Status: AC
  Administered 2023-01-26: 20 mg via INTRAVENOUS
  Filled 2023-01-26: qty 50

## 2023-01-26 MED ORDER — KETOROLAC TROMETHAMINE 30 MG/ML IJ SOLN
INTRAMUSCULAR | Status: AC
  Filled 2023-01-26: qty 1

## 2023-01-26 MED ORDER — WITCH HAZEL-GLYCERIN EX PADS: 1.0000 | MEDICATED_PAD | CUTANEOUS | Status: DC | PRN

## 2023-01-26 MED ORDER — DIPHENHYDRAMINE HCL 25 MG PO CAPS: 25.0000 mg | ORAL_CAPSULE | Freq: Four times a day (QID) | ORAL | Status: DC | PRN

## 2023-01-26 MED ORDER — KETOROLAC TROMETHAMINE 30 MG/ML IJ SOLN: 30.0000 mg | Freq: Four times a day (QID) | INTRAMUSCULAR | Status: AC | PRN

## 2023-01-26 MED ORDER — HYDROMORPHONE HCL 1 MG/ML IJ SOLN: 0.2500 mg | INTRAMUSCULAR | Status: DC | PRN

## 2023-01-26 MED ORDER — PHENYLEPHRINE HCL (PRESSORS) 10 MG/ML IV SOLN
INTRAVENOUS | Status: DC | PRN
  Administered 2023-01-26 (×2): 180 ug via INTRAVENOUS

## 2023-01-26 MED ORDER — KETOROLAC TROMETHAMINE 30 MG/ML IJ SOLN: 30.0000 mg | Freq: Once | INTRAMUSCULAR | Status: DC | PRN

## 2023-01-26 MED ORDER — DEXMEDETOMIDINE HCL IN NACL 80 MCG/20ML IV SOLN
INTRAVENOUS | Status: DC | PRN
  Administered 2023-01-26 (×2): 8 ug via INTRAVENOUS

## 2023-01-26 MED ORDER — ACETAMINOPHEN 10 MG/ML IV SOLN
INTRAVENOUS | Status: DC | PRN
  Administered 2023-01-26: 1000 mg via INTRAVENOUS

## 2023-01-26 MED ORDER — SENNOSIDES-DOCUSATE SODIUM 8.6-50 MG PO TABS
2.0000 | ORAL_TABLET | Freq: Every day | ORAL | Status: DC
  Administered 2023-01-27 – 2023-01-28 (×2): 2 via ORAL
  Filled 2023-01-26 (×2): qty 2

## 2023-01-26 MED ORDER — STERILE WATER FOR IRRIGATION IR SOLN
Status: DC | PRN
  Administered 2023-01-26: 1000 mL

## 2023-01-26 MED ORDER — ACETAMINOPHEN 325 MG PO TABS
650.0000 mg | ORAL_TABLET | ORAL | Status: DC | PRN
  Administered 2023-01-28 (×2): 650 mg via ORAL
  Filled 2023-01-26 (×2): qty 2

## 2023-01-26 MED ORDER — PHENYLEPHRINE HCL-NACL 20-0.9 MG/250ML-% IV SOLN
INTRAVENOUS | Status: DC | PRN
  Administered 2023-01-26: 60 ug/min via INTRAVENOUS

## 2023-01-26 MED ORDER — OXYCODONE HCL 5 MG PO TABS: 5.0000 mg | ORAL_TABLET | Freq: Once | ORAL | Status: DC | PRN

## 2023-01-26 MED ORDER — ACETAMINOPHEN 500 MG PO TABS
1000.0000 mg | ORAL_TABLET | Freq: Four times a day (QID) | ORAL | Status: DC
  Administered 2023-01-26 (×3): 1000 mg via ORAL
  Filled 2023-01-26 (×4): qty 2

## 2023-01-26 MED ORDER — SOD CITRATE-CITRIC ACID 500-334 MG/5ML PO SOLN
30.0000 mL | Freq: Once | ORAL | Status: AC
  Administered 2023-01-26: 30 mL via ORAL
  Filled 2023-01-26: qty 30

## 2023-01-26 MED ORDER — OXYCODONE HCL 5 MG/5ML PO SOLN: 5.0000 mg | Freq: Once | ORAL | Status: DC | PRN

## 2023-01-26 MED ORDER — ONDANSETRON HCL 4 MG/2ML IJ SOLN
INTRAMUSCULAR | Status: AC
  Filled 2023-01-26: qty 2

## 2023-01-26 MED ORDER — PRENATAL MULTIVITAMIN CH
1.0000 | ORAL_TABLET | Freq: Every day | ORAL | Status: DC
  Administered 2023-01-26 – 2023-01-27 (×2): 1 via ORAL
  Filled 2023-01-26 (×2): qty 1

## 2023-01-26 MED ORDER — ACETAMINOPHEN 10 MG/ML IV SOLN
INTRAVENOUS | Status: AC
  Filled 2023-01-26: qty 100

## 2023-01-26 SURGICAL SUPPLY — 27 items
CLAMP UMBILICAL CORD (MISCELLANEOUS) ×1 IMPLANT
CLOTH BEACON ORANGE TIMEOUT ST (SAFETY) ×1 IMPLANT
DRSG OPSITE POSTOP 4X10 (GAUZE/BANDAGES/DRESSINGS) ×1 IMPLANT
ELECT REM PT RETURN 9FT ADLT (ELECTROSURGICAL) ×1
ELECTRODE REM PT RTRN 9FT ADLT (ELECTROSURGICAL) ×1 IMPLANT
GLOVE BIOGEL PI IND STRL 7.0 (GLOVE) ×1 IMPLANT
GLOVE ORTHO TXT STRL SZ7.5 (GLOVE) ×1 IMPLANT
GOWN STRL REUS W/TWL LRG LVL3 (GOWN DISPOSABLE) ×2 IMPLANT
NDL HYPO 25X5/8 SAFETYGLIDE (NEEDLE) ×1 IMPLANT
NEEDLE HYPO 25X5/8 SAFETYGLIDE (NEEDLE) ×1 IMPLANT
NS IRRIG 1000ML POUR BTL (IV SOLUTION) ×1 IMPLANT
PACK C SECTION WH (CUSTOM PROCEDURE TRAY) ×1 IMPLANT
PAD OB MATERNITY 4.3X12.25 (PERSONAL CARE ITEMS) ×1 IMPLANT
RTRCTR C-SECT PINK 25CM LRG (MISCELLANEOUS) ×1 IMPLANT
STRIP CLOSURE SKIN 1/2X4 (GAUZE/BANDAGES/DRESSINGS) IMPLANT
SUT CHROMIC 1 CTX 36 (SUTURE) ×2 IMPLANT
SUT PLAIN 0 NONE (SUTURE) IMPLANT
SUT PLAIN 2 0 (SUTURE) ×1
SUT PLAIN 2 0 XLH (SUTURE) IMPLANT
SUT PLAIN ABS 2-0 CT1 27XMFL (SUTURE) IMPLANT
SUT VIC AB 0 CT1 27 (SUTURE) ×2
SUT VIC AB 0 CT1 27XBRD ANBCTR (SUTURE) ×2 IMPLANT
SUT VIC AB 2-0 CT1 (SUTURE) ×1 IMPLANT
SUT VIC AB 4-0 KS 27 (SUTURE) IMPLANT
TOWEL OR 17X24 6PK STRL BLUE (TOWEL DISPOSABLE) ×1 IMPLANT
TRAY FOLEY W/BAG SLVR 14FR LF (SET/KITS/TRAYS/PACK) ×1 IMPLANT
WATER STERILE IRR 1000ML POUR (IV SOLUTION) ×1 IMPLANT

## 2023-01-26 NOTE — Plan of Care (Signed)
Progressing

## 2023-01-26 NOTE — Anesthesia Procedure Notes (Signed)
Spinal  Patient location during procedure: OR Start time: 01/26/2023 2:25 AM End time: 01/26/2023 2:30 AM Reason for block: surgical anesthesia Staffing Performed: anesthesiologist  Anesthesiologist: Lannie Fields, DO Performed by: Lannie Fields, DO Authorized by: Lannie Fields, DO   Preanesthetic Checklist Completed: patient identified, IV checked, risks and benefits discussed, surgical consent, monitors and equipment checked, pre-op evaluation and timeout performed Spinal Block Patient position: sitting Prep: DuraPrep and site prepped and draped Patient monitoring: cardiac monitor, continuous pulse ox and blood pressure Approach: midline Location: L3-4 Injection technique: single-shot Needle Needle type: Pencan  Needle gauge: 24 G Needle length: 9 cm Assessment Sensory level: T6 Events: CSF return Additional Notes Functioning IV was confirmed and monitors were applied. Sterile prep and drape, including hand hygiene and sterile gloves were used. The patient was positioned and the spine was prepped. The skin was anesthetized with lidocaine.  Free flow of clear CSF was obtained prior to injecting local anesthetic into the CSF.  The spinal needle aspirated freely following injection.  The needle was carefully withdrawn.  The patient tolerated the procedure well.

## 2023-01-26 NOTE — Op Note (Signed)
NAMEJACE, [name] MEDICAL RECORD NO: [id number] ACCOUNT NO: [id number] DATE OF BIRTH: 03/25/1986 FACILITY: [facility] LOCATION: [facility] PHYSICIAN: Virgina Evener. Claiborne Billings, DO  Operative Report   DATE OF PROCEDURE: 01/26/2023  PREOPERATIVE DIAGNOSIS:  Desired repeat C-section, declined trial of labor.  POSTOPERATIVE DIAGNOSIS:  Desired repeat C-section, declined trial of labor.  PROCEDURE:  Low transverse cesarean section.  SURGEON:  Philip Aspen, DO.  ASSISTANT:  Wylene Simmer, MD.  An experienced assistant was required given the standard of surgical care and given the complexity of the case.  The assistant was needed for exposure, dissection, suctioning, retraction, instrument exchange, assist with  delivery and administration of fundal pressure and overall help during surgery.  ANESTHESIA:  Spinal.  ESTIMATED BLOOD LOSS:  Please see anesthesia report, not excessive.  SPECIMENS:  Cord blood.  FINDINGS:  Female infant, cephalic presentation, clear fluid, Apgars 8 and 9, weight 6 pounds 13 ounces.  Normal tubes and ovaries bilaterally.  Bladder tacked high over lower uterine segment.  COMPLICATIONS:  None.    CONDITION:  Stable to PACU.  DESCRIPTION OF PROCEDURE:  The patient was taken to the operating room where spinal anesthesia was administered and found to be adequate.  She was prepped and draped in the normal sterile fashion in dorsal supine position.  A scalpel was used to make a  Pfannenstiel skin incision, which was carried down to underlying layer of fascia with Bovie cautery.  Fascia was incised in the midline with the scalpel and extended laterally with Mayo scissors.  Kocher clamps were placed at the superior aspect of the  fascial incision.  Rectus muscles were dissected off bluntly and sharply.  Kocher clamps were then placed at the inferior aspect of the fascial incision and rectus muscles were again dissected off bluntly and sharply.  Hemostat was used to separate   rectus muscles superiorly and the peritoneum was entered bluntly.  Peritoneal incision was extended by manual lateral traction.  The abdomen was manually surveyed and no upper abdominal or omental adhesions were noted.  Visualizing inferiorly thick fatty  tissue with overlying the lower uterine segment.  Metzenbaums were used to enter the vesicouterine peritoneum sharply.  This was extended laterally and the adhesions were taken down and creation of bladder flap, which was developed digitally.  An Information systems manager was then placed.  A scalpel was used to make a low transverse cesarean incision and the amniotic sac was entered sharply.  Amniotic fluid was clear, the hysterotomy was extended with cephalic and caudal traction.  The infant's head was  easily located, elevated and delivered without difficulty followed by the remainder of the infant's body.  The infant was bulb suctioned and dried while delayed cord clamping was performed.  Uterine incision was inspected and T clamps were placed for  hemostasis at 1 minute, the cord was doubly clamped and cut and infant was handed off to awaiting neonatology.  Cord blood was collected and external uterine massage was performed with gentle traction of the umbilical cord. The placenta was removed and  the uterus was cleared of all clot and debris.  The uterine incision was then reapproximated and closed with Vicryl in a running locked fashion followed by second layer of vertical imbrication. One figure-of-eight was placed through the left of midline  and excellent hemostasis was noted.  All surfaces were examined and found to be hemostatic.  The peritoneum was reapproximated and closed with Monocryl in a running fashion followed by reapproximation of the fascia, which was  closed with looped PDS in a  running fashion.  The subcutaneous tissue was irrigated and dry.  Minimal use of Bovie cautery was needed for hemostasis.  Plain gut suture was used to close  subcutaneous tissue and 5 interrupted sutures.  The skin was then reapproximated and closed with  Vicryl on a Keith needle.  Patient tolerated the procedure well.  Sponge, lap and needle counts were correct x 2.  The patient was taken to PACU in stable condition.     PAA D: 01/26/2023 3:44:29 am T: 01/26/2023 4:22:00 am  JOB: [id number]/ [id number]

## 2023-01-26 NOTE — Anesthesia Postprocedure Evaluation (Signed)
Anesthesia Post Note  Patient: [name]  Procedure(s) Performed: CESAREAN SECTION (Abdomen)     Patient location during evaluation: PACU Anesthesia Type: Spinal Level of consciousness: awake and alert and oriented Pain management: pain level controlled Vital Signs Assessment: post-procedure vital signs reviewed and stable Respiratory status: spontaneous breathing, nonlabored ventilation and respiratory function stable Cardiovascular status: blood pressure returned to baseline and stable Postop Assessment: no headache, no backache, spinal receding and patient able to bend at knees Anesthetic complications: no   No notable events documented.  Last Vitals:  Vitals:   01/26/23 0445 01/26/23 0455  BP: 110/65 103/62  Pulse: 78 71  Resp: 15 16  Temp:  36.7 C  SpO2: 94% 96%    Last Pain:  Vitals:   01/26/23 0455  TempSrc: Oral  PainSc: 0-No pain   Pain Goal:                Epidural/Spinal Function Cutaneous sensation: Able to Wiggle Toes (01/26/23 0455), Patient able to flex knees: Yes (01/26/23 0455), Patient able to lift hips off bed: No (01/26/23 0455), Back pain beyond tenderness at insertion site: No (01/26/23 0455), Progressively worsening motor and/or sensory loss: No (01/26/23 0455), Bowel and/or bladder incontinence post epidural: No (01/26/23 0455)  Lannie Fields

## 2023-01-26 NOTE — Brief Op Note (Signed)
01/26/2023  3:35 AM  PATIENT:  [name]  37 y.o. female  PRE-OPERATIVE DIAGNOSIS:  repeat c-section  POST-OPERATIVE DIAGNOSIS:  repeat c-section  PROCEDURE:  Procedure(s): CESAREAN SECTION (N/A)- low transverse  SURGEON:  Surgeons and Role:    Philip Aspen, DO - Primary    * Joanne Gavel, MD - Fellow  FINDINGS: vesicouterine adhesions, female infant in cephalic presentations, APGARS 8/9, clear fluid, wt 6#13, normal tubes and ovaries bilaterally  ANESTHESIA:   spinal  EBL:  see anesthesia report, not excessive   BLOOD ADMINISTERED:none  SPECIMEN:  Source of Specimen:  cord blood  DISPOSITION OF SPECIMEN:  N/A  COUNTS:  YES  PLAN OF CARE: Admit to inpatient   PATIENT DISPOSITION:  PACU - hemodynamically stable.   Delay start of Pharmacological VTE agent (>24hrs) due to surgical blood loss or risk of bleeding: not applicable

## 2023-01-26 NOTE — Anesthesia Preprocedure Evaluation (Addendum)
Anesthesia Evaluation  Patient identified by MRN, date of birth, ID band Patient awake    Reviewed: Allergy & Precautions, Patient's Chart, lab work & pertinent test results  Airway Mallampati: II  TM Distance: >3 FB Neck ROM: Full    Dental  (+) Dental Advisory Given, Teeth Intact   Pulmonary neg pulmonary ROS   Pulmonary exam normal breath sounds clear to auscultation       Cardiovascular negative cardio ROS Normal cardiovascular exam Rhythm:Regular Rate:Normal     Neuro/Psych negative neurological ROS  negative psych ROS   GI/Hepatic negative GI ROS, Neg liver ROS,,,  Endo/Other  Obesity BMI 35  Renal/GU negative Renal ROS  negative genitourinary   Musculoskeletal negative musculoskeletal ROS (+)    Abdominal  (+) + obese  Peds negative pediatric ROS (+)  Hematology Hb 10.8, plt 268   Anesthesia Other Findings   Reproductive/Obstetrics (+) Pregnancy Prior section                             Anesthesia Physical Anesthesia Plan  ASA: 2  Anesthesia Plan: Spinal   Post-op Pain Management: Regional block, Toradol IV (intra-op)* and Ofirmev IV (intra-op)*   Induction:   PONV Risk Score and Plan: 3 and Ondansetron, Dexamethasone and Treatment may vary due to age or medical condition  Airway Management Planned: Natural Airway and Nasal Cannula  Additional Equipment: None  Intra-op Plan:   Post-operative Plan:   Informed Consent: I have reviewed the patients History and Physical, chart, labs and discussed the procedure including the risks, benefits and alternatives for the proposed anesthesia with the patient or authorized representative who has indicated his/her understanding and acceptance.       Plan Discussed with: CRNA  Anesthesia Plan Comments: (RCS presents to MAU ruptured)       Anesthesia Quick Evaluation

## 2023-01-26 NOTE — H&P (Signed)
37 y.o. [redacted]w[redacted]d  G3P1011 comes in c/o leaking fluid.  Found to be ruptured in MAU. Otherwise has good fetal movement and no bleeding.  Desiring repeat C/S.  Past Medical History:  Diagnosis Date   Medical history non-contributory     Past Surgical History:  Procedure Laterality Date   CESAREAN SECTION     FINGER SURGERY      OB History  Gravida Para Term Preterm AB Living  3 1 1   1 1   SAB IAB Ectopic Multiple Live Births  1 0 0   1    # Outcome Date GA Lbr Len/2nd Weight Sex Type Anes PTL Lv  3 Current           2 SAB           1 Term      CS-LTranv       Social History   Socioeconomic History   Marital status: Married    Spouse name: Not on file   Number of children: Not on file   Years of education: Not on file   Highest education level: Not on file  Occupational History   Not on file  Tobacco Use   Smoking status: Never   Smokeless tobacco: Never  Vaping Use   Vaping status: Never Used  Substance and Sexual Activity   Alcohol use: Not Currently   Drug use: Never   Sexual activity: Not on file  Other Topics Concern   Not on file  Social History Narrative   Not on file   Social Determinants of Health   Financial Resource Strain: Not on file  Food Insecurity: Not on file  Transportation Needs: Not on file  Physical Activity: Not on file  Stress: Not on file  Social Connections: Unknown (04/05/2022)   Received from [facility]   Social Network    Social Network: Not on file  Intimate Partner Violence: Unknown (04/05/2022)   Received from [facility], [facility]   HITS    Physically Hurt: Not on file    Insult or Talk Down To: Not on file    Threaten Physical Harm: Not on file    Scream or Curse: Not on file   Patient has no known allergies.    Prenatal Transfer Tool  Maternal Diabetes: No Genetic Screening: Declined Maternal Ultrasounds/Referrals: Normal, suboptimal view of spine Fetal Ultrasounds or other Referrals:   None Maternal Substance Abuse:  No Significant Maternal Medications:  None Significant Maternal Lab Results: Group B Strep positive  Other PNC: uncomplicated.    Vitals:   01/26/23 0108  BP: 127/82  Pulse: 81  Resp: 18  Temp: 98.3 F (36.8 C)  TempSrc: Oral  Weight: 93.1 kg  Height: 5\' 4"  (1.626 m)    Lungs/Cor:  NAD Abdomen:  soft, gravid Ex:  no cords, erythema SVE:  closed per MAU FHTs:  145, good STV, NST R; Cat 1 tracing. Toco:  q2-4   A/P   Admit to L&D for repeat C/S at term  GBS Pos  Ancef/Azith for surgical prophylaxis  Other routine care  Philip Aspen

## 2023-01-26 NOTE — Transfer of Care (Signed)
Immediate Anesthesia Transfer of Care Note  Patient: [name]  Procedure(s) Performed: CESAREAN SECTION (Abdomen)  Patient Location: PACU  Anesthesia Type:Spinal  Level of Consciousness: awake, alert , and oriented  Airway & Oxygen Therapy: Patient Spontanous Breathing  Post-op Assessment: Report given to RN and Post -op Vital signs reviewed and stable  Post vital signs: Reviewed and stable  Last Vitals:  Vitals Value Taken Time  BP 149/130 01/26/23 0346  Temp    Pulse 90 01/26/23 0348  Resp 17 01/26/23 0348  SpO2 94 % 01/26/23 0348  Vitals shown include unfiled device data.  Last Pain:  Vitals:   01/26/23 0108  TempSrc: Oral  PainSc: 8          Complications: No notable events documented.

## 2023-01-26 NOTE — Progress Notes (Signed)
POSTPARTUM POSTOP PROGRESS NOTE  POD #0  Subjective:  No acute events since delivery.  Pt denies problems with po intake.  She denies nausea or vomiting.  Pain is well controlled.  She has not had flatus. She has not had bowel movement.  Lochia Minimal. Baby formula feeding. Planning to work on breastfeeding as well  Objective: Blood pressure 108/70, pulse 75, temperature 98.3 F (36.8 C), temperature source Oral, resp. rate 16, height 5\' 4"  (1.626 m), weight 93.1 kg, SpO2 97%, unknown if currently breastfeeding.  Physical Exam:  General: alert, cooperative and no distress Lochia:normal flow Chest: CTAB Heart: RRR no m/r/g Abdomen: +BS, soft, nontender Uterine Fundus: firm, 1cm below umbilicus. Honeycomb dressing intact, negdrainage Extremities: trace pedal edema, neg calf TTP BL, neg Homans BL  Recent Labs    01/26/23 0140 01/26/23 0629  HGB 10.8* 9.5*  HCT 35.1* 31.0*    Assessment/Plan:  ASSESSMENT: [name] is a 37 y.o. [id number] s/p ERLTCS @ [redacted]w[redacted]d for SROM. PNC c/b Ama, atypical chest pain s/p workup (benign).   Lactation consult Postop anemia - has not yet ambulated, asymptomatic at rest, PO iron ordered Anticipate DC home POD#2-3 Plan for catheter DC and void/ambulation later this afternoon   LOS: 0 days

## 2023-01-26 NOTE — MAU Note (Signed)
Pt says sch for C/S 9-10 Says SROM at 0030- clear fluid VE today - closed - Dr Reina Fuse  Denies HSV UC's - 8/10

## 2023-01-26 NOTE — Lactation Note (Signed)
This note was copied from a baby's chart. Lactation Consultation Note  Patient Name: [name] [id number] Date: 01/26/2023 Age:36 hours Reason for consult: Initial assessment;Early term 37-38.6wks;Breastfeeding assistance  P2, [redacted]w[redacted]d- Mother stated that infant was too tired to latch, so she formula fed infant at 0700 and 0900. Mother states that she wants to only breastfeed, but she brought Enfamil GentleEase formula to the hospital because she was only able to breastfeed her first child for 5 weeks.   With mother's permission, LC reviewed hand expression with mother. Mother was then able to mirror LC's motions and successfully express a couple of drops. LC used a spoon to entice the infant into latching on mother's right breast. LC and mother had infant on mother's right breast in the football hold. With multiple unsuccessful attempts to latch infant, we changed infant to the left breast in the cross cradle hold. Infant still would not latch.   LC then educated mother on how to pump to stimulate her breasts. Mother was given size 18 flanges for the best fit. LC also educated mother on how to pace feed infant with a bottle and the recommendations on mL dosages per age. Mother confirmed that she understood the education. LC encouraged mother to reach out to [facility] for an additional needs.   Maternal Data Has patient been taught Hand Expression?: Yes Does the patient have breastfeeding experience prior to this delivery?: Yes How long did the patient breastfeed?: 5 weeks and decided to stop due to mastitis and supply drying up.  Feeding Mother's Current Feeding Choice: Breast Milk and Formula  LATCH Score Latch: Repeated attempts needed to sustain latch, nipple held in mouth throughout feeding, stimulation needed to elicit sucking reflex.  Audible Swallowing: None  Type of Nipple: Flat  Comfort (Breast/Nipple): Soft / non-tender  Hold (Positioning): Assistance needed to correctly position  infant at breast and maintain latch.  LATCH Score: 5   Lactation Tools Discussed/Used Tools: Pump;Flanges Flange Size: 18 Breast pump type: Double-Electric Breast Pump Pump Education: Setup, frequency, and cleaning;Milk Storage Reason for Pumping: Stimulation and supplementation due to being early term. Pumping frequency: Q3hr  Interventions Interventions: Breast feeding basics reviewed;Assisted with latch;Hand express;Adjust position;Support pillows;Position options;Expressed milk;Education;Pace feeding;LC Services brochure;CDC Guidelines for Breast Pump Cleaning   Consult Status Consult Status: Follow-up Date: 01/27/23 Follow-up type: In-patient    Dema Severin 01/26/2023, 11:51 AM

## 2023-01-27 MED ORDER — ACETAMINOPHEN 500 MG PO TABS
1000.0000 mg | ORAL_TABLET | Freq: Once | ORAL | Status: AC
  Administered 2023-01-27: 1000 mg via ORAL
  Filled 2023-01-27: qty 2

## 2023-01-27 NOTE — Progress Notes (Signed)
Subjective: Postpartum Day 1: Cesarean Delivery Patient is doing well this morning. Pain is controlled. Ambulating, voiding, tolerating PO. Minimal lochia. Breastfeeding.   Objective: Patient Vitals for the past 24 hrs:  BP Temp Temp src Pulse Resp SpO2  01/27/23 0542 128/79 97.7 F (36.5 C) Oral 88 16 97 %  01/26/23 2100 113/60 97.9 F (36.6 C) Oral 79 16 100 %  01/26/23 1628 103/67 98.1 F (36.7 C) Oral 93 16 --     Physical Exam:  General: alert, cooperative, and no distress Lochia: appropriate Uterine Fundus: firm Incision: healing well, no significant drainage, no dehiscence, no significant erythema DVT Evaluation: No evidence of DVT seen on physical exam.  Recent Labs    01/26/23 0140 01/26/23 0629  HGB 10.8* 9.5*  HCT 35.1* 31.0*    Assessment/Plan: [name] [id number] POD#1 sp RCS at [redacted]w[redacted]d 1. PPC: routine PP care 2. Rh pos 3. Dispo: anticipate discharge tomorrow  [name] 01/27/2023, 9:40 AM

## 2023-01-28 ENCOUNTER — Other Ambulatory Visit: Payer: Self-pay

## 2023-01-28 ENCOUNTER — Other Ambulatory Visit (HOSPITAL_BASED_OUTPATIENT_CLINIC_OR_DEPARTMENT_OTHER): Payer: Self-pay

## 2023-01-28 MED ORDER — IBUPROFEN 600 MG PO TABS
600.0000 mg | ORAL_TABLET | Freq: Four times a day (QID) | ORAL | 0 refills | Status: AC
  Filled 2023-01-28: qty 30, 8d supply, fill #0

## 2023-01-28 NOTE — Progress Notes (Signed)
POD #2 LTCS Doing well, some swelling in feet, wants to go home Afeb, VSS Abd- soft, fundus firm, incision intact, d/c home

## 2023-01-28 NOTE — Discharge Instructions (Signed)
As per discharge pamphlet °

## 2023-01-28 NOTE — Discharge Summary (Signed)
Postpartum Discharge Summary      Patient Name: [name] DOB: 11-12-1985 MRN: [id number]  Date of admission: 01/26/2023 Delivery date:01/26/2023 Delivering provider: Philip Aspen Date of discharge: 01/28/2023  Admitting diagnosis: Term pregnancy [Z34.90] Cesarean delivery delivered [O82] Maternal care due to low transverse uterine scar from previous cesarean delivery [O34.211] Intrauterine pregnancy: [redacted]w[redacted]d     Secondary diagnosis:  Principal Problem:   Term pregnancy Active Problems:   Cesarean delivery delivered   Maternal care due to low transverse uterine scar from previous cesarean delivery    Discharge diagnosis: Term Pregnancy Delivered                                               Hospital course: Onset of Labor With Unplanned C/S   37 y.o. yo [id number] at [redacted]w[redacted]d was admitted in Latent Labor with [facility] 01/26/2023. The patient went for cesarean section due to Elective Repeat. Delivery details as follows: Membrane Rupture Time/Date: 12:30 AM,01/26/2023  Delivery Method:C-Section, Low Transverse Details of operation can be found in separate operative note. Patient had a postpartum course complicated by nothing.  She is ambulating,tolerating a regular diet, passing flatus, and urinating well.  Patient is discharged home in stable condition 01/28/23.  Newborn Data: Birth date:01/26/2023 Birth time:2:52 AM Gender:Female Living status:Living Apgars:8 ,9  Weight:3090 g  Physical exam  Vitals:   01/27/23 0542 01/27/23 1500 01/27/23 2129 01/28/23 0455  BP: 128/79 128/83 130/85 135/85  Pulse: 88 76 78   Resp: 16 16 16 18   Temp: 97.7 F (36.5 C) 98 F (36.7 C) 98 F (36.7 C) 98.1 F (36.7 C)  TempSrc: Oral Oral Oral Oral  SpO2: 97%  99% 100%  Weight:      Height:       General: alert Lochia: appropriate Uterine Fundus: firm Incision: Healing well with no significant drainage  Labs: Lab Results  Component Value Date   WBC 16.0 (H) 01/26/2023   HGB 9.5 (L)  01/26/2023   HCT 31.0 (L) 01/26/2023   MCV 79.7 (L) 01/26/2023   PLT 234 01/26/2023      Latest Ref Rng & Units 11/11/2022   12:21 PM  CMP  Glucose 70 - 99 mg/dL 77   BUN 6 - 20 mg/dL 6   Creatinine 8.46 - 9.62 mg/dL 9.52   Sodium 841 - 324 mmol/L 138   Potassium 3.5 - 5.2 mmol/L 4.6   Chloride 96 - 106 mmol/L 101   CO2 20 - 29 mmol/L 24   Calcium 8.7 - 10.2 mg/dL 9.1    Edinburgh Score:    01/26/2023   11:16 PM  Edinburgh Postnatal Depression Scale Screening Tool  I have been able to laugh and see the funny side of things. 0  I have looked forward with enjoyment to things. 0  I have blamed myself unnecessarily when things went wrong. 1  I have been anxious or worried for no good reason. 2  I have felt scared or panicky for no good reason. 0  Things have been getting on top of me. 0  I have been so unhappy that I have had difficulty sleeping. 0  I have felt sad or miserable. 0  I have been so unhappy that I have been crying. 0  The thought of harming myself has occurred to me. 0  Edinburgh Postnatal Depression Scale Total  3      After visit meds:  Allergies as of 01/28/2023   No Known Allergies      Medication List     TAKE these medications    ibuprofen 600 MG tablet Commonly known as: ADVIL Take 1 tablet (600 mg total) by mouth every 6 (six) hours.   PRE-NATAL FORMULA PO Take 1 tablet by mouth daily.         Discharge home in stable condition Infant Feeding: Breast Infant Disposition:home with mother Discharge instruction: per After Visit Summary and Postpartum booklet. Activity: Advance as tolerated. Pelvic rest for 6 weeks.  Diet: routine diet Postpartum Appointment:2 weeks Future Appointments: Future Appointments  Date Time Provider Department Center  02/14/2023 11:20 AM Tobb, Lavona Mound, DO [facility] None   Follow up Visit:  Follow-up Information     [facility] Schedule an appointment as soon as possible for a visit in 2  week(s).   Contact information: [address]  [address] [place] [address] [phone]                     01/28/2023 Zenaida Niece, MD

## 2023-01-28 NOTE — Lactation Note (Signed)
This note was copied from a baby's chart. Lactation Consultation Note  Patient Name: [name] Date: 01/28/2023 Age:37 hours  According to report from nurse, [name] has decided to only formula feed her baby. RN will educate mother regarding management of non-nursing engorgement.      Consult Status  complete    Omar Person 01/28/2023, 10:01 AM

## 2023-02-07 ENCOUNTER — Encounter (HOSPITAL_COMMUNITY)
Admission: RE | Admit: 2023-02-07 | Discharge: 2023-02-07 | Disposition: A | Discharge status: Home / Self Care | Payer: BC Managed Care – PPO | Source: Ambulatory Visit | Attending: Obstetrics and Gynecology | Admitting: Obstetrics and Gynecology

## 2023-02-08 ENCOUNTER — Inpatient Hospital Stay (HOSPITAL_COMMUNITY)
Admission: AD | Admit: 2023-02-08 | Payer: BC Managed Care – PPO | Source: Home / Self Care | Admitting: Obstetrics and Gynecology

## 2023-02-08 DIAGNOSIS — O34211 Maternal care for low transverse scar from previous cesarean delivery: Secondary | ICD-10-CM

## 2023-02-14 ENCOUNTER — Telehealth: Payer: Self-pay

## 2023-02-14 ENCOUNTER — Ambulatory Visit: Payer: BC Managed Care – PPO | Admitting: Cardiology

## 2023-02-14 VITALS — BP 124/81 | HR 72 | Ht 64.0 in | Wt 182.0 lb

## 2023-02-14 DIAGNOSIS — I471 Supraventricular tachycardia, unspecified: Secondary | ICD-10-CM

## 2023-02-14 DIAGNOSIS — I4729 Other ventricular tachycardia: Secondary | ICD-10-CM

## 2023-02-14 NOTE — Patient Instructions (Signed)
Medication Instructions:  Your physician recommends that you continue on your current medications as directed. Please refer to the Current Medication list given to you today.  *If you need a refill on your cardiac medications before your next appointment, please call your pharmacy*   Lab Work: None   Testing/Procedures: None   Follow-Up: At [facility], you and your health needs are our priority.  As part of our continuing mission to provide you with exceptional heart care, we have created designated Provider Care Teams.  These Care Teams include your primary Cardiologist (physician) and Advanced Practice Providers (APPs -  Physician Assistants and Nurse Practitioners) who all work together to provide you with the care you need, when you need it.  Your next appointment:    As needed  Provider:   Thomasene Ripple, DO

## 2023-02-14 NOTE — Telephone Encounter (Signed)
  Patient Consent for Virtual Visit        [name] has provided verbal consent on 02/14/2023 for a virtual visit (video or telephone).   CONSENT FOR VIRTUAL VISIT FOR:  [name]  By participating in this virtual visit I agree to the following:  I hereby voluntarily request, consent and authorize [facility] and its employed or contracted physicians, physician assistants, nurse practitioners or other licensed health care professionals (the Practitioner), to provide me with telemedicine health care services (the "Services") as deemed necessary by the treating Practitioner. I acknowledge and consent to receive the Services by the Practitioner via telemedicine. I understand that the telemedicine visit will involve communicating with the Practitioner through live audiovisual communication technology and the disclosure of certain medical information by electronic transmission. I acknowledge that I have been given the opportunity to request an in-person assessment or other available alternative prior to the telemedicine visit and am voluntarily participating in the telemedicine visit.  I understand that I have the right to withhold or withdraw my consent to the use of telemedicine in the course of my care at any time, without affecting my right to future care or treatment, and that the Practitioner or I may terminate the telemedicine visit at any time. I understand that I have the right to inspect all information obtained and/or recorded in the course of the telemedicine visit and may receive copies of available information for a reasonable fee.  I understand that some of the potential risks of receiving the Services via telemedicine include:  Delay or interruption in medical evaluation due to technological equipment failure or disruption; Information transmitted may not be sufficient (e.g. poor resolution of images) to allow for appropriate medical decision making by the Practitioner;  and/or  In rare instances, security protocols could fail, causing a breach of personal health information.  Furthermore, I acknowledge that it is my responsibility to provide information about my medical history, conditions and care that is complete and accurate to the best of my ability. I acknowledge that Practitioner's advice, recommendations, and/or decision may be based on factors not within their control, such as incomplete or inaccurate data provided by me or distortions of diagnostic images or specimens that may result from electronic transmissions. I understand that the practice of medicine is not an exact science and that Practitioner makes no warranties or guarantees regarding treatment outcomes. I acknowledge that a copy of this consent can be made available to me via my patient portal [facility] MyChart), or I can request a printed copy by calling the office of [facility].    I understand that my insurance will be billed for this visit.   I have read or had this consent read to me. I understand the contents of this consent, which adequately explains the benefits and risks of the Services being provided via telemedicine.  I have been provided ample opportunity to ask questions regarding this consent and the Services and have had my questions answered to my satisfaction. I give my informed consent for the services to be provided through the use of telemedicine in my medical care

## 2023-02-15 ENCOUNTER — Other Ambulatory Visit (HOSPITAL_BASED_OUTPATIENT_CLINIC_OR_DEPARTMENT_OTHER): Payer: Self-pay

## 2023-02-28 ENCOUNTER — Telehealth (HOSPITAL_COMMUNITY): Payer: Self-pay | Admitting: *Deleted

## 2023-02-28 NOTE — Telephone Encounter (Signed)
02/28/2023  Name: [name] MRN: [id number] DOB: 12-Mar-1986  Reason for Call:  Transition of Care Hospital Discharge Call  Contact Status: Patient Contact Status: Message  Language assistant needed:          Follow-Up Questions:    Inocente Salles Postnatal Depression Scale:  In the Past 7 Days:    PHQ2-9 Depression Scale:     Discharge Follow-up:    Post-discharge interventions: NA  Salena Saner, RN 02/28/2023 15:05

## 2023-10-27 ENCOUNTER — Emergency Department (HOSPITAL_BASED_OUTPATIENT_CLINIC_OR_DEPARTMENT_OTHER)
Admission: EM | Admit: 2023-10-27 | Discharge: 2023-10-27 | Disposition: A | Discharge status: Home / Self Care | Payer: Self-pay | Attending: Emergency Medicine | Admitting: Emergency Medicine

## 2023-10-27 ENCOUNTER — Other Ambulatory Visit: Payer: Self-pay

## 2023-10-27 ENCOUNTER — Emergency Department (HOSPITAL_BASED_OUTPATIENT_CLINIC_OR_DEPARTMENT_OTHER): Payer: Self-pay

## 2023-10-27 ENCOUNTER — Encounter (HOSPITAL_BASED_OUTPATIENT_CLINIC_OR_DEPARTMENT_OTHER): Payer: Self-pay

## 2023-10-27 DIAGNOSIS — R1031 Right lower quadrant pain: Secondary | ICD-10-CM | POA: Insufficient documentation

## 2023-10-27 DIAGNOSIS — R109 Unspecified abdominal pain: Secondary | ICD-10-CM

## 2023-10-27 LAB — BASIC METABOLIC PANEL WITH GFR
Anion gap: 11 (ref 5–15)
BUN: 12 mg/dL (ref 6–20)
CO2: 26 mmol/L (ref 22–32)
Calcium: 9.8 mg/dL (ref 8.9–10.3)
Chloride: 105 mmol/L (ref 98–111)
Creatinine, Ser: 0.78 mg/dL (ref 0.44–1.00)
GFR, Estimated: >60 mL/min (ref >60)
Glucose, Bld: 123 mg/dL — ABNORMAL HIGH (ref 70–99)
Potassium: 4.3 mmol/L (ref 3.5–5.1)
Sodium: 142 mmol/L (ref 135–145)

## 2023-10-27 LAB — URINALYSIS, ROUTINE W REFLEX MICROSCOPIC
Bilirubin Urine: NEGATIVE
Glucose, UA: NEGATIVE mg/dL
Hgb urine dipstick: NEGATIVE
Ketones, ur: NEGATIVE mg/dL
Leukocytes,Ua: NEGATIVE
Nitrite: NEGATIVE
Protein, ur: NEGATIVE mg/dL
Specific Gravity, Urine: >=1.03 (ref 1.005–1.030)
pH: 6 (ref 5.0–8.0)

## 2023-10-27 LAB — CBC
HCT: 39.8 % (ref 36.0–46.0)
Hemoglobin: 12.2 g/dL (ref 12.0–15.0)
MCH: 24.8 pg — ABNORMAL LOW (ref 26.0–34.0)
MCHC: 30.7 g/dL (ref 30.0–36.0)
MCV: 80.9 fL (ref 80.0–100.0)
Platelets: 383 10*3/uL (ref 150–400)
RBC: 4.92 MIL/uL (ref 3.87–5.11)
RDW: 15 % (ref 11.5–15.5)
WBC: 8.6 10*3/uL (ref 4.0–10.5)
nRBC: 0 % (ref 0.0–0.2)

## 2023-10-27 LAB — HEPATIC FUNCTION PANEL
ALT: 16 U/L (ref 0–44)
AST: 17 U/L (ref 15–41)
Albumin: 4.7 g/dL (ref 3.5–5.0)
Alkaline Phosphatase: 94 U/L (ref 38–126)
Bilirubin, Direct: 0.1 mg/dL (ref 0.0–0.2)
Indirect Bilirubin: 0.1 mg/dL — ABNORMAL LOW (ref 0.3–0.9)
Total Bilirubin: 0.2 mg/dL (ref 0.0–1.2)
Total Protein: 7.7 g/dL (ref 6.5–8.1)

## 2023-10-27 LAB — LIPASE, BLOOD: Lipase: 36 U/L (ref 11–51)

## 2023-10-27 LAB — PREGNANCY, URINE: Preg Test, Ur: NEGATIVE

## 2023-10-27 MED ORDER — OXYCODONE HCL 5 MG PO TABS: 5.0000 mg | ORAL_TABLET | ORAL | 0 refills | Status: AC | PRN

## 2023-10-27 MED ORDER — KETOROLAC TROMETHAMINE 15 MG/ML IJ SOLN
15.0000 mg | Freq: Once | INTRAMUSCULAR | Status: AC
  Administered 2023-10-27: 15 mg via INTRAVENOUS
  Filled 2023-10-27: qty 1

## 2023-10-27 MED ORDER — ONDANSETRON HCL 4 MG/2ML IJ SOLN
4.0000 mg | Freq: Once | INTRAMUSCULAR | Status: AC
  Administered 2023-10-27: 4 mg via INTRAVENOUS
  Filled 2023-10-27: qty 2

## 2023-10-27 MED ORDER — SODIUM CHLORIDE 0.9 % IV BOLUS
1000.0000 mL | Freq: Once | INTRAVENOUS | Status: AC
  Administered 2023-10-27: 1000 mL via INTRAVENOUS

## 2023-10-27 MED ORDER — MORPHINE SULFATE (PF) 4 MG/ML IV SOLN
4.0000 mg | Freq: Once | INTRAVENOUS | Status: AC
  Administered 2023-10-27: 4 mg via INTRAVENOUS
  Filled 2023-10-27: qty 1

## 2023-10-27 NOTE — ED Triage Notes (Signed)
 Pt arrives with c/o right sided flank that started a few days ago. Per tp, the pain has started to radiate to her right ABD  over the past day or so. Pt endorses nausea. Pt denies fevers or urinary symptoms.

## 2023-10-27 NOTE — ED Notes (Signed)
 ED Provider at bedside.

## 2023-10-27 NOTE — ED Provider Notes (Signed)
 [place] [facility] AT [facility] Provider Note  CSN: [id number] Arrival date & time: 10/27/23 1700  Chief Complaint(s) Flank Pain  HPI [name] is a 38 y.o. female with past medical history as below, significant for C-section who presents to the ED with complaint of right sided flank/right lower quad abdominal pain  Symptoms ongoing the past 24 hours.  Began in the right flank has since progressed to the right lower quadrant.  Sharp, stabbing pain associate with some nausea.  She is tolerant p.o. intake.  No vomiting.  Appetite is normal.  No change in bowel or bladder function.  No similar symptoms in the past.  No abdominal trauma.  Symptoms seem to worsen with torso twisting or ambulation.  Past Medical History Past Medical History:  Diagnosis Date   Medical history non-contributory    Patient Active Problem List   Diagnosis Date Noted   Term pregnancy 01/26/2023   Cesarean delivery delivered 01/26/2023   Maternal care due to low transverse uterine scar from previous cesarean delivery 01/26/2023   Home Medication(s) Prior to Admission medications   Medication Sig Start Date End Date Taking? Authorizing Provider  oxyCODONE  (ROXICODONE ) 5 MG immediate release tablet Take 1 tablet (5 mg total) by mouth every 4 (four) hours as needed for severe pain (pain score 7-10). 10/27/23  Yes Russella Courts A, DO  ibuprofen  (ADVIL ) 600 MG tablet Take 1 tablet (600 mg total) by mouth every 6 (six) hours. Patient not taking: Reported on 02/14/2023 01/28/23   Meisinger, Ena Harries, MD  Prenatal Multivit-Min-Fe-FA (PRE-NATAL FORMULA PO) Take 1 tablet by mouth daily. Patient not taking: Reported on 02/14/2023    [provider]                                                                                                                                    Past Surgical History Past Surgical History:  Procedure Laterality Date   CESAREAN SECTION     CESAREAN SECTION N/A  01/26/2023   Procedure: CESAREAN SECTION;  Surgeon: Atlas Blank, DO;  Location: [facility] LD ORS;  Service: Obstetrics;  Laterality: N/A;   FINGER SURGERY     Family History History reviewed. No pertinent family history.  Social History Social History   Tobacco Use   Smoking status: Never   Smokeless tobacco: Never  Vaping Use   Vaping status: Never Used  Substance Use Topics   Alcohol use: Not Currently   Drug use: Never   Allergies Patient has no known allergies.  Review of Systems A thorough review of systems was obtained and all systems are negative except as noted in the HPI and PMH.   Physical Exam Vital Signs  I have reviewed the triage vital signs BP 125/84   Pulse 69   Temp 98.9 F (37.2 C) (Oral)   Resp 16   Ht 5\' 4"  (1.626 m)   Wt 72.6 kg  SpO2 97%   Breastfeeding No   BMI 27.46 kg/m  Physical Exam Vitals and nursing note reviewed.  Constitutional:      General: She is not in acute distress.    Appearance: Normal appearance.  HENT:     Head: Normocephalic and atraumatic.     Right Ear: External ear normal.     Left Ear: External ear normal.     Nose: Nose normal.     Mouth/Throat:     Mouth: Mucous membranes are moist.  Eyes:     General: No scleral icterus.       Right eye: No discharge.        Left eye: No discharge.  Cardiovascular:     Rate and Rhythm: Normal rate and regular rhythm.     Pulses: Normal pulses.     Heart sounds: Normal heart sounds.  Pulmonary:     Effort: Pulmonary effort is normal. No respiratory distress.     Breath sounds: Normal breath sounds. No stridor.  Abdominal:     General: Abdomen is flat. There is no distension.     Palpations: Abdomen is soft.     Tenderness: There is abdominal tenderness. Negative signs include Murphy's sign and Rovsing's sign.    Musculoskeletal:     Cervical back: No rigidity.     Right lower leg: No edema.     Left lower leg: No edema.  Skin:    General: Skin is warm and dry.      Capillary Refill: Capillary refill takes less than 2 seconds.  Neurological:     Mental Status: She is alert.  Psychiatric:        Mood and Affect: Mood normal.        Behavior: Behavior normal. Behavior is cooperative.     ED Results and Treatments Labs (all labs ordered are listed, but only abnormal results are displayed) Labs Reviewed  CBC - Abnormal; Notable for the following components:      Result Value   MCH 24.8 (*)    All other components within normal limits  BASIC METABOLIC PANEL WITH GFR - Abnormal; Notable for the following components:   Glucose, Bld 123 (*)    All other components within normal limits  HEPATIC FUNCTION PANEL - Abnormal; Notable for the following components:   Indirect Bilirubin 0.1 (*)    All other components within normal limits  URINALYSIS, ROUTINE W REFLEX MICROSCOPIC  PREGNANCY, URINE  LIPASE, BLOOD                                                                                                                          Radiology US  PELVIC COMPLETE W TRANSVAGINAL AND TORSION R/O Result Date: 10/27/2023 CLINICAL DATA:  Right lower quadrant pain since yesterday. History of C-section. LMP 10/17/2023. EXAM: TRANSABDOMINAL AND TRANSVAGINAL ULTRASOUND OF PELVIS DOPPLER ULTRASOUND OF OVARIES TECHNIQUE: Both transabdominal and transvaginal ultrasound examinations of the pelvis were performed. Transabdominal technique was performed for global  imaging of the pelvis including uterus, ovaries, adnexal regions, and pelvic cul-de-sac. It was necessary to proceed with endovaginal exam following the transabdominal exam to visualize the uterus and ovaries. Color and duplex Doppler ultrasound was utilized to evaluate blood flow to the ovaries. COMPARISON:  Same day CT abdomen pelvis FINDINGS: Uterus Measurements: 8.4 x 4.0 x 5.6 cm = volume: 100 mL. No fibroids or other mass visualized. Nabothian cysts in the cervix. C-section scar. Endometrium Thickness: 7 mm.  No focal  abnormality visualized. Right ovary Measurements: 5.1 x 3.4 x 3.3 cm = volume: 30.3 mL. Enlarged heterogenous with increased flow. There is a 2.2 x 1.8 x 2.3 cm cyst within the ovary. Left ovary Measurements: 2.0 x 0.9 x 1.9 cm = volume: 1.8 mL. Normal appearance/no adnexal mass. Pulsed Doppler evaluation of both ovaries demonstrates normal low-resistance arterial and venous waveforms. Other findings Small volume free fluid in the pelvis. Complex free fluid adjacent to the right ovary. IMPRESSION: Enlarged heterogenous right ovary with increased flow and complex free fluid adjacent to the right ovary. Differential considerations include right ovarian torsion/de-torsion versus a ruptured hemorrhagic cyst. Pulsed Doppler evaluation of the right ovary showed venous and arterial waveforms. Electronically Signed   By: Rozell Cornet M.D.   On: 10/27/2023 22:15   CT Renal Stone Study Result Date: 10/27/2023 CLINICAL DATA:  Right abdominal and flank pain.  Stone suspected. EXAM: CT ABDOMEN AND PELVIS WITHOUT CONTRAST TECHNIQUE: Multidetector CT imaging of the abdomen and pelvis was performed following the standard protocol without IV contrast. RADIATION DOSE REDUCTION: This exam was performed according to the departmental dose-optimization program which includes automated exposure control, adjustment of the mA and/or kV according to patient size and/or use of iterative reconstruction technique. COMPARISON:  CT with IV contrast 08/13/2021. FINDINGS: Lower chest: Lung bases are clear with mild chronic elevation of the right hemidiaphragm. The cardiac size is normal. Hepatobiliary: The liver is 18 cm length with slight steatosis. No mass is seen without contrast. Gallbladder bile ducts are unremarkable. Pancreas: Unremarkable without contrast. Spleen: Slightly prominent measuring 12.7 cm AP, stable. No mass is seen without contrast. Adrenals/Urinary Tract: There is no adrenal mass and no contour deforming abnormality in  the unenhanced kidneys. There is no urinary stone or obstruction. The bladder is contracted and not well seen but there are no inflammatory changes around it. Stomach/Bowel: Unremarkable gastric wall. Unremarkable unopacified small bowel. No inflammatory changes, wall thickening or dilatation. Normal retrocecal appendix. Moderate retained stool ascending colon. Normal stool in the remainder. No evidence of colitis or diverticulitis. Vascular/Lymphatic: No significant vascular findings are present. No enlarged abdominal or pelvic lymph nodes. Reproductive: Uterus and bilateral adnexa are unremarkable. Other: Trace pelvic cul-de-sac low-density fluid, typically physiologic at this age and was seen previously. No free hemorrhage, free air focal inflammatory process. Horizontal subcutaneous scarring extends across the low anterior pelvic wall, possibly indicating a prior C-section. Musculoskeletal: No acute or significant osseous findings. IMPRESSION: 1. No acute noncontrast CT findings in the abdomen or pelvis. 2. No urinary stone or obstruction. 3. Slight hepatic steatosis.  Slightly prominent spleen. 4. Trace pelvic cul-de-sac low-density fluid, typically physiologic at this age and was seen previously. 5. Moderate retained stool ascending colon. Electronically Signed   By: Denman Fischer M.D.   On: 10/27/2023 20:55    Pertinent labs & imaging results that were available during my care of the patient were reviewed by me and considered in my medical decision making (see MDM for details).  Medications Ordered  in ED Medications  morphine  (PF) 4 MG/ML injection 4 mg (4 mg Intravenous Given 10/27/23 2019)  sodium chloride  0.9 % bolus 1,000 mL (0 mLs Intravenous Stopped 10/27/23 2204)  ondansetron  (ZOFRAN ) injection 4 mg (4 mg Intravenous Given 10/27/23 2018)  ketorolac  (TORADOL ) 15 MG/ML injection 15 mg (15 mg Intravenous Given 10/27/23 2230)                                                                                                                                      Procedures Procedures  (including critical care time)  Medical Decision Making / ED Course    Medical Decision Making:    Oveda Dadamo is a 38 y.o. female with past medical history as below, significant for C-section who presents to the ED with complaint of right sided flank/right lower quad abdominal pain. The complaint involves an extensive differential diagnosis and also carries with it a high risk of complications and morbidity.  Serious etiology was considered. Ddx includes but is not limited to: Differential diagnosis includes but is not exclusive to ectopic pregnancy, ovarian cyst, ovarian torsion, acute appendicitis, urinary tract infection, endometriosis, bowel obstruction, hernia, colitis, renal colic, gastroenteritis, volvulus etc.   Complete initial physical exam performed, notably the patient was in no acute distress, HDS.    Reviewed and confirmed nursing documentation for past medical history, family history, social history.  Vital signs reviewed.     Brief summary:  38 year old female history as above including C-section here with right side abdominal pain.  Initially flank now right lower quadrant.  Nausea.  No vomiting. TTP to right lower quadrant   Clinical Course as of 10/27/23 2311  Thu Oct 27, 2023  2253 Spoke w/ Dr Avanell Bob, as pain has resolved recommend discharge with close office f/u. Return if pain returns [SG]  2304 WBC: 8.6 Improved from prior [SG]    Clinical Course User Index [SG] Teddi Favors, DO     Reviewed labs/imaging. Possible intermittent torsion noted on ultrasound vs cyst rupture. Spoke w/ dr Avanell Bob as above. Pt is feeling much better, ambulatory. Vital signs are stable. Will plan for discharge with close outpatient follow up. Very strict return precautions for the patient were discussed.  Patient in no distress and overall condition improved here in the ED. Detailed discussions were  had with the patient/guardian regarding current findings, and need for close f/u with PCP or on call doctor. The patient/guardian has been instructed to return immediately if the symptoms worsen in any way for re-evaluation. Patient/guardian verbalized understanding and is in agreement with current care plan. All questions answered prior to discharge.       Additional history obtained: -Additional history obtained from na -External records from outside source obtained and reviewed including: Chart review including previous notes, labs, imaging, consultation notes including  Prior gynecology documentation, cardiology documentation Pdmp   Lab Tests: -I ordered, reviewed, and interpreted labs.  The pertinent results include:   Labs Reviewed  CBC - Abnormal; Notable for the following components:      Result Value   MCH 24.8 (*)    All other components within normal limits  BASIC METABOLIC PANEL WITH GFR - Abnormal; Notable for the following components:   Glucose, Bld 123 (*)    All other components within normal limits  HEPATIC FUNCTION PANEL - Abnormal; Notable for the following components:   Indirect Bilirubin 0.1 (*)    All other components within normal limits  URINALYSIS, ROUTINE W REFLEX MICROSCOPIC  PREGNANCY, URINE  LIPASE, BLOOD    Notable for labs stable  EKG   EKG Interpretation Date/Time:    Ventricular Rate:    PR Interval:    QRS Duration:    QT Interval:    QTC Calculation:   R Axis:      Text Interpretation:           Imaging Studies ordered: I ordered imaging studies including CT/TVUS I independently visualized the following imaging with scope of interpretation limited to determining acute life threatening conditions related to emergency care; findings noted above I agree with the radiologist interpretation If any imaging was obtained with contrast I closely monitored patient for any possible adverse reaction a/w contrast administration in the  emergency department   Medicines ordered and prescription drug management: Meds ordered this encounter  Medications   morphine  (PF) 4 MG/ML injection 4 mg   sodium chloride  0.9 % bolus 1,000 mL   ondansetron  (ZOFRAN ) injection 4 mg   ketorolac  (TORADOL ) 15 MG/ML injection 15 mg   oxyCODONE  (ROXICODONE ) 5 MG immediate release tablet    Sig: Take 1 tablet (5 mg total) by mouth every 4 (four) hours as needed for severe pain (pain score 7-10).    Dispense:  5 tablet    Refill:  0    -I have reviewed the patients home medicines and have made adjustments as needed   Consultations Obtained: I requested consultation with the Dr Avanell Bob,  and discussed lab and imaging findings as well as pertinent plan - they recommend: plan discharge/ follow up   Cardiac Monitoring: Continuous pulse oximetry interpreted by myself, 98% on RA.    Social Determinants of Health:  Diagnosis or treatment significantly limited by social determinants of health: na   Reevaluation: After the interventions noted above, I reevaluated the patient and found that they have improved  Co morbidities that complicate the patient evaluation  Past Medical History:  Diagnosis Date   Medical history non-contributory       Dispostion: Disposition decision including need for hospitalization was considered, and patient discharged from emergency department.    Final Clinical Impression(s) / ED Diagnoses Final diagnoses:  Abdominal pain, unspecified abdominal location        Teddi Favors, DO 10/27/23 2311

## 2023-10-27 NOTE — Discharge Instructions (Addendum)
 It was a pleasure caring for you today in the emergency department.  If your symptoms return please return to the ER for a recheck. Follow up with obgyn / pcp next week  Please return to the emergency department for any worsening or worrisome symptoms.
